# Patient Record
Sex: Male | Born: 1970
Health system: Southern US, Community
[De-identification: ages and names within clinical notes are randomized; demographics above are authoritative.]

## PROBLEM LIST (undated history)

## (undated) DIAGNOSIS — M1990 Unspecified osteoarthritis, unspecified site: Secondary | ICD-10-CM

## (undated) DIAGNOSIS — G4730 Sleep apnea, unspecified: Secondary | ICD-10-CM

## (undated) DIAGNOSIS — Z8614 Personal history of Methicillin resistant Staphylococcus aureus infection: Secondary | ICD-10-CM

## (undated) HISTORY — DX: Sleep apnea, unspecified: G47.30

## (undated) HISTORY — DX: Unspecified osteoarthritis, unspecified site: M19.90

---

## 2002-12-31 HISTORY — PX: OTHER SURGICAL HISTORY: SHX169

## 2003-05-02 DIAGNOSIS — Z8614 Personal history of Methicillin resistant Staphylococcus aureus infection: Secondary | ICD-10-CM

## 2003-05-02 HISTORY — DX: Personal history of Methicillin resistant Staphylococcus aureus infection: Z86.14

## 2007-03-27 HISTORY — PX: OTHER SURGICAL HISTORY: SHX169

## 2008-05-20 ENCOUNTER — Ambulatory Visit: Payer: Self-pay | Admitting: Family Medicine

## 2013-10-10 ENCOUNTER — Other Ambulatory Visit: Payer: Self-pay | Admitting: *Deleted

## 2013-10-10 ENCOUNTER — Ambulatory Visit (INDEPENDENT_AMBULATORY_CARE_PROVIDER_SITE_OTHER): Payer: BC Managed Care – PPO

## 2013-10-10 ENCOUNTER — Encounter: Payer: Self-pay | Admitting: Podiatry

## 2013-10-10 ENCOUNTER — Ambulatory Visit (INDEPENDENT_AMBULATORY_CARE_PROVIDER_SITE_OTHER): Payer: BC Managed Care – PPO | Admitting: Podiatry

## 2013-10-10 VITALS — BP 128/75 | HR 94 | Resp 16

## 2013-10-10 DIAGNOSIS — M722 Plantar fascial fibromatosis: Secondary | ICD-10-CM

## 2013-10-10 MED ORDER — TRIAMCINOLONE ACETONIDE 10 MG/ML IJ SUSP
10.0000 mg | Freq: Once | INTRAMUSCULAR | Status: AC
  Administered 2013-10-10: 10 mg

## 2013-10-10 NOTE — Patient Instructions (Signed)

## 2013-10-10 NOTE — Progress Notes (Signed)
Subjective:     Patient ID: Brett West, male   DOB: 05/07/1970, 43 y.o.   MRN: [id number]  HPI patient states that my right heel has started to really hurting and it is getting worse as time goes on   Review of Systems     Objective:   Physical Exam Neurovascular status intact with severe pain plantar aspect right heel at the insertion of the tendon into the calcaneus    Assessment:     Plantar fasciitis of the right heel    Plan:     Injected the right plantar fascia 3 mg Kenalog 5 mg like Marcaine mixture reviewed x-ray and discussed continued orthotic usage

## 2013-12-12 ENCOUNTER — Ambulatory Visit (INDEPENDENT_AMBULATORY_CARE_PROVIDER_SITE_OTHER): Payer: BC Managed Care – PPO | Admitting: Podiatry

## 2013-12-12 VITALS — BP 130/87 | HR 101 | Resp 16

## 2013-12-12 DIAGNOSIS — M79609 Pain in unspecified limb: Secondary | ICD-10-CM

## 2013-12-12 DIAGNOSIS — M722 Plantar fascial fibromatosis: Secondary | ICD-10-CM

## 2013-12-12 MED ORDER — TRIAMCINOLONE ACETONIDE 10 MG/ML IJ SUSP
10.0000 mg | Freq: Once | INTRAMUSCULAR | Status: AC
  Administered 2013-12-12: 10 mg

## 2013-12-12 NOTE — Progress Notes (Signed)
Subjective:     Patient ID: Brett West, male   DOB: 02/08/1971, 43 y.o.   MRN: [id number]  HPI patient states the right heel continues to hurt in its worse after periods of sitting or when he gets up in the morning   Review of Systems     Objective:   Physical Exam Neurovascular status intact with muscle strength adequate and found to have exquisite discomfort right plantar heel at the insertional point of the tendon into the calcaneus    Assessment:     Reoccurrence of plantar fasciitis right heel with inflammation    Plan:     Injected the right plantar fascia 3 mg Kenalog 5 of vesica Marcaine mixture and dispensed night splint with all instructions on usage. We are going to rehabilitation his old orthotics and he'll be seen back when those are returned

## 2014-01-09 ENCOUNTER — Encounter: Payer: Self-pay | Admitting: Podiatry

## 2014-01-28 ENCOUNTER — Encounter: Payer: Self-pay | Admitting: Podiatry

## 2014-03-06 ENCOUNTER — Ambulatory Visit (INDEPENDENT_AMBULATORY_CARE_PROVIDER_SITE_OTHER): Payer: BC Managed Care – PPO | Admitting: Podiatry

## 2014-03-06 VITALS — BP 130/77 | HR 97 | Resp 16

## 2014-03-06 DIAGNOSIS — M722 Plantar fascial fibromatosis: Secondary | ICD-10-CM

## 2014-03-06 MED ORDER — TRIAMCINOLONE ACETONIDE 10 MG/ML IJ SUSP
10.0000 mg | Freq: Once | INTRAMUSCULAR | Status: AC
  Administered 2014-03-06: 10 mg

## 2014-03-08 NOTE — Progress Notes (Signed)
Subjective:     Patient ID: Brett West, male   DOB: 05/13/1970, 43 y.o.   MRN: [id number]  HPIpatient continues to experience significant discomfort in the plantar heel right with inflammation and comes to pick up new orthotics today   Review of Systems     Objective:   Physical Exam Neurovascular status intact with continued discomfort in the plantar heel right at the insertion with fluid buildup noted    Assessment:     Combination acute and chronic plantar fasciitis right    Plan:     Reviewed condition and did careful plantar injections 3 mg Kenalog 5 mg Xylocaine and then dispensed new orthotics which may be too narrow for him and if they give him trouble we will recast him. Reappoint to recheck in 4 weeks

## 2014-04-03 ENCOUNTER — Ambulatory Visit (INDEPENDENT_AMBULATORY_CARE_PROVIDER_SITE_OTHER): Payer: BC Managed Care – PPO | Admitting: Podiatry

## 2014-04-03 ENCOUNTER — Encounter: Payer: Self-pay | Admitting: Podiatry

## 2014-04-03 ENCOUNTER — Ambulatory Visit: Payer: BC Managed Care – PPO | Admitting: Podiatry

## 2014-04-03 DIAGNOSIS — M722 Plantar fascial fibromatosis: Secondary | ICD-10-CM

## 2014-04-05 NOTE — Progress Notes (Signed)
Subjective:     Patient ID: Brett West, male   DOB: 03/26/1971, 43 y.o.   MRN: [id number]  HPI patient presents with pain in the heel that remains and moderate obesity which is certainly a complicating factor for this patient   Review of Systems     Objective:   Physical Exam Neurovascular status unchanged with severe discomfort plantar fascia still noted with improvement from previous treatments but present and unable to wear orthotics in all of his shoes    Assessment:     Mechanical dysfunction with chronic plantar fasciitis occurring    Plan:     Reviewed condition and recommended long-term different types of orthotics to try to disperse weight pattern better and scanned him for a second pair of orthotics which will act better in dress shoes and other shoes he needs to wear for work. Continue physical therapy and anti-inflammatory usage

## 2014-08-14 ENCOUNTER — Encounter: Payer: Self-pay | Admitting: Podiatry

## 2014-08-14 ENCOUNTER — Ambulatory Visit (INDEPENDENT_AMBULATORY_CARE_PROVIDER_SITE_OTHER): Payer: 59 | Admitting: Podiatry

## 2014-08-14 DIAGNOSIS — M722 Plantar fascial fibromatosis: Secondary | ICD-10-CM

## 2014-08-17 NOTE — Progress Notes (Signed)
Subjective:     Patient ID: Brett West, male   DOB: 01/25/1971, 44 y.o.   MRN: [id number]  HPI patient presents stating my right heel has been killing me and I'm not able to do any type of activities that I want to do on a daily basis   Review of Systems     Objective:   Physical Exam Neurovascular status intact with muscle strength adequate and range of motion within normal limits. Patient continues to have extensive discomfort right plantar heel at the insertional point of the tendon into the calcaneus with fluid buildup at the insertion and pain also in the center lateral side which is compensation and into the forefoot secondary to lifting the heel quicklty    Assessment:     Long-term chronic plantar fasciitis right which is significantly inhibiting his ability to ambulate    Plan:     Reviewed condition at great length due to long-standing nature failure to do any type of activities and gave it's probably best to consider correcting this. Patient wants to have it fixed and I allowed him to review a consent form going over alternative treatments that have been tried along with complications. Reviewed that he may develop pain in his arch and pain in other parts of his foot which can last for a while along with all complications listed. Patient wants surgery signed consent form is scheduled in the next 4 weeks for outpatient surgical procedure consisting of endoscopic plantar fasciotomy

## 2014-08-21 ENCOUNTER — Telehealth: Payer: Self-pay | Admitting: *Deleted

## 2014-08-21 NOTE — Telephone Encounter (Signed)
L/M to [facility] refurbish orthotics

## 2014-09-03 ENCOUNTER — Telehealth: Payer: Self-pay | Admitting: *Deleted

## 2014-09-03 NOTE — Telephone Encounter (Signed)
LEFT MESSAGE FOR PATIENT REGARDING ORTHOTICS

## 2014-09-18 ENCOUNTER — Telehealth: Payer: Self-pay | Admitting: *Deleted

## 2014-09-18 NOTE — Telephone Encounter (Signed)
Patient called stating that his insurance wasn't effective until after June and wanted to reschedule to June 7th.

## 2014-09-29 ENCOUNTER — Encounter: Payer: BLUE CROSS/BLUE SHIELD | Admitting: Podiatry

## 2014-10-01 ENCOUNTER — Telehealth: Payer: Self-pay | Admitting: *Deleted

## 2014-10-01 NOTE — Telephone Encounter (Signed)
We will cancel surgery for now due to insurance problems

## 2014-10-13 ENCOUNTER — Encounter: Payer: Self-pay | Admitting: Podiatry

## 2015-01-26 ENCOUNTER — Telehealth: Payer: Self-pay

## 2015-01-26 NOTE — Telephone Encounter (Signed)
Mailed out card to pt letting them  know that  orthotics where in and ready for pick up .

## 2015-03-15 ENCOUNTER — Ambulatory Visit (INDEPENDENT_AMBULATORY_CARE_PROVIDER_SITE_OTHER): Payer: 59 | Admitting: Podiatry

## 2015-03-15 ENCOUNTER — Encounter: Payer: Self-pay | Admitting: Podiatry

## 2015-03-15 VITALS — BP 139/85 | HR 70 | Resp 16

## 2015-03-15 DIAGNOSIS — M722 Plantar fascial fibromatosis: Secondary | ICD-10-CM | POA: Diagnosis not present

## 2015-03-15 NOTE — Patient Instructions (Signed)
Pre-Operative Instructions  Congratulations, you have decided to take an important step to improving your quality of life.  You can be assured that the doctors of [facility] will be with you every step of the way.  1. Plan to be at the surgery center/hospital at least 1 (one) hour prior to your scheduled time unless otherwise directed by the surgical center/hospital staff.  You must have a responsible adult accompany you, remain during the surgery and drive you home.  Make sure you have directions to the surgical center/hospital and know how to get there on time. 2. For hospital based surgery you will need to obtain a history and physical form from your family physician within 1 month prior to the date of surgery- we will give you a form for you primary physician.  3. We make every effort to accommodate the date you request for surgery.  There are however, times where surgery dates or times have to be moved.  We will contact you as soon as possible if a change in schedule is required.   4. No Aspirin/Ibuprofen for one week before surgery.  If you are on aspirin, any non-steroidal anti-inflammatory medications (Mobic, Aleve, Ibuprofen) you should stop taking it 7 days prior to your surgery.  You make take Tylenol  For pain prior to surgery.  5. Medications- If you are taking daily heart and blood pressure medications, seizure, reflux, allergy, asthma, anxiety, pain or diabetes medications, make sure the surgery center/hospital is aware before the day of surgery so they may notify you which medications to take or avoid the day of surgery. 6. No food or drink after midnight the night before surgery unless directed otherwise by surgical center/hospital staff. 7. No alcoholic beverages 24 hours prior to surgery.  No smoking 24 hours prior to or 24 hours after surgery. 8. Wear loose pants or shorts- loose enough to fit over bandages, boots, and casts. 9. No slip on shoes, sneakers are best. 10. Bring  your boot with you to the surgery center/hospital.  Also bring crutches or a walker if your physician has prescribed it for you.  If you do not have this equipment, it will be provided for you after surgery. 11. If you have not been contracted by the surgery center/hospital by the day before your surgery, call to confirm the date and time of your surgery. 12. Leave-time from work may vary depending on the type of surgery you have.  Appropriate arrangements should be made prior to surgery with your employer. 13. Prescriptions will be provided immediately following surgery by your doctor.  Have these filled as soon as possible after surgery and take the medication as directed. 14. Remove nail polish on the operative foot. 15. Wash the night before surgery.  The night before surgery wash the foot and leg well with the antibacterial soap provided and water paying special attention to beneath the toenails and in between the toes.  Rinse thoroughly with water and dry well with a towel.  Perform this wash unless told not to do so by your physician.  Enclosed: 1 Ice pack (please put in freezer the night before surgery)   1 Hibiclens skin cleaner   Pre-op Instructions  If you have any questions regarding the instructions, do not hesitate to call our office.  [place]: [address] [place], [place] [address] [phone]  [place]: [address] [place], [place] [address] [phone]  [place]: [address]  [place], [place] [address] [phone]  Dr. Richard   Tuchman DPM, Dr. Norman Regal DPM Dr. Richard Sikora DPM, Dr. M. Todd Hyatt DPM, Dr. Kathryn Egerton DPM 

## 2015-03-17 NOTE — Progress Notes (Signed)
Subjective:     Patient ID: Brett RobinsonJames Kwasny, male   DOB: 09/30/1970, 44 y.o.   MRN: [id number]  HPI patient states that this heel is still hurting and it's not seeming to get better and I had to have the other one fixed   Review of Systems     Objective:   Physical Exam Neurovascular status intact muscle strength adequate range of motion within normal limits with exquisite discomfort plantar aspect right heel at the insertional point of the tendon into the calcaneus with inflammation and fluid around the medial band noted    Assessment:     Continued plantar fasciitis right with failure to respond to numerous conservative care including injection treatment orthotic the treatment anti-inflammatories and night splint    Plan:     Reviewed condition at great length and due to long-term history and the fact the other one need to be corrected it's been recommended that this one be corrected. Patient wants surgery and I allowed patient to read consent form for correction explaining all alternative treatments and complications. Patient signed consent form and is given all preoperative instructions for the procedure and will have this done in the next few weeks

## 2015-03-24 ENCOUNTER — Ambulatory Visit (INDEPENDENT_AMBULATORY_CARE_PROVIDER_SITE_OTHER): Payer: 59 | Admitting: Family Medicine

## 2015-03-24 ENCOUNTER — Encounter: Payer: Self-pay | Admitting: Family Medicine

## 2015-03-24 VITALS — BP 130/80 | HR 95 | Temp 97.8°F | Resp 16 | Wt 323.8 lb

## 2015-03-24 DIAGNOSIS — Z91030 Bee allergy status: Secondary | ICD-10-CM | POA: Insufficient documentation

## 2015-03-24 DIAGNOSIS — T63441A Toxic effect of venom of bees, accidental (unintentional), initial encounter: Secondary | ICD-10-CM | POA: Insufficient documentation

## 2015-03-24 DIAGNOSIS — J309 Allergic rhinitis, unspecified: Secondary | ICD-10-CM | POA: Insufficient documentation

## 2015-03-24 DIAGNOSIS — J069 Acute upper respiratory infection, unspecified: Secondary | ICD-10-CM

## 2015-03-24 DIAGNOSIS — G4733 Obstructive sleep apnea (adult) (pediatric): Secondary | ICD-10-CM | POA: Insufficient documentation

## 2015-03-24 DIAGNOSIS — M1990 Unspecified osteoarthritis, unspecified site: Secondary | ICD-10-CM | POA: Insufficient documentation

## 2015-03-24 DIAGNOSIS — J3089 Other allergic rhinitis: Secondary | ICD-10-CM

## 2015-03-24 DIAGNOSIS — Z9989 Dependence on other enabling machines and devices: Secondary | ICD-10-CM

## 2015-03-24 DIAGNOSIS — J329 Chronic sinusitis, unspecified: Secondary | ICD-10-CM | POA: Insufficient documentation

## 2015-03-24 MED ORDER — MONTELUKAST SODIUM 10 MG PO TABS: 10.0000 mg | ORAL_TABLET | Freq: Every day | ORAL | Status: DC

## 2015-03-24 NOTE — Progress Notes (Signed)
       Patient: Brett RobinsonJames West Male    DOB: 03/01/1971   44 y.o.   MRN: [id number] Visit Date: 03/24/2015  Today's Provider: Lorie PhenixNancy Andilynn Delavega, MD   Chief Complaint  Patient presents with  . Sinusitis   Subjective:    Sinusitis This is a new problem. The current episode started in the past 7 days. The problem is unchanged. There has been no fever. Associated symptoms include congestion, coughing, headaches, sinus pressure and sneezing. Pertinent negatives include no ear pain, shortness of breath or sore throat. Past treatments include nothing.    Started with a little cold last week, for one day. Then, started with congestion and draining since over the weekend.  No fevers. No sore throat. Does have a cough.       No Known Allergies Previous Medications   EPINEPHRINE, ANAPHYLAXIS THERAPY AGENTS,    EPIPEN, 0.3MG /0.3ML (Injection Device)  1 Device Use as directed, as needed for 0 days  Quantity: 2;  Refills: 5   Ordered :03-Apr-2011  Brett West, Brett West ;  Started 03-Apr-2011 Active Comments: Medication taken as needed. Generic please.   FLUTICASONE (FLONASE) 50 MCG/ACT NASAL SPRAY    Place into the nose as needed.     Review of Systems  HENT: Positive for congestion, postnasal drip, rhinorrhea, sinus pressure and sneezing. Negative for ear pain and sore throat.   Respiratory: Positive for cough and wheezing. Negative for chest tightness and shortness of breath.   Neurological: Positive for headaches.    Social History  Substance Use Topics  . Smoking status: Former Games developermoker  . Smokeless tobacco: Not on file  . Alcohol Use: No   Objective:   BP 130/80 mmHg  Pulse 95  Temp(Src) 97.8 F (36.6 C) (Oral)  Resp 16  Wt 323 lb 12.8 oz (146.875 kg)  SpO2 96%  Physical Exam  Constitutional: He is oriented to person, place, and time. He appears well-developed and well-nourished.  HENT:  Head: Normocephalic and atraumatic.  Right Ear: Tympanic membrane and external ear normal.    Left Ear: Tympanic membrane and external ear normal.  Mouth/Throat: Uvula is midline and oropharynx is clear and moist.  Eyes: Conjunctivae and EOM are normal. Pupils are equal, round, and reactive to light.  Neck: Normal range of motion. Neck supple.  Cardiovascular: Normal rate and regular rhythm.   Pulmonary/Chest: Effort normal and breath sounds normal. He has no wheezes. He has no rales.  Neurological: He is alert and oriented to person, place, and time.        Assessment & Plan:     1. Other allergic rhinitis Will restart medication. Patient instructed to call back if condition worsens or does not improve.    - montelukast (SINGULAIR) 10 MG tablet; Take 1 tablet (10 mg total) by mouth at bedtime.  Dispense: 30 tablet; Refill: 3  2. Upper respiratory infection Suspect viral URI is underlying cause of current symptoms.   Mucinex BID as needed.  Patient instructed to call back if condition worsens or does not improve, especially fever, better and then worse again, or any new or unusual symptoms.    Follow up for CPE. Scheduled for foot surgery in about 2 weeks.        Lorie PhenixNancy Galen Russman, MD  [facility]

## 2015-04-01 HISTORY — PX: PLANTAR FASCIECTOMY: SUR600

## 2015-04-08 ENCOUNTER — Telehealth: Payer: Self-pay | Admitting: *Deleted

## 2015-04-08 NOTE — Telephone Encounter (Signed)
Authorization was obtained for surgery scheduled for 04/13/2015 for an Endoscopic Plantar Fasciotomy right foot by [employer] [employer].  Authorization number is [id number].  Authorization was faxed to Aram [facility]Cynthia at [facility].

## 2015-04-13 DIAGNOSIS — M722 Plantar fascial fibromatosis: Secondary | ICD-10-CM | POA: Diagnosis not present

## 2015-04-14 ENCOUNTER — Telehealth: Payer: Self-pay | Admitting: *Deleted

## 2015-04-14 NOTE — Telephone Encounter (Signed)
Pt's wife Lockie ParesJaclyn called states pt was told to call if there was blood on his dressing.  I called pt at [phone] and pt states the area of blood is a little larger than a 50 cent piece, and dry.  I told the pt that was fine, it was his blood, sterile, and made a great protective splint.  Pt states he got the Hydrocodone 10/300 filled but his insurance did not cover, I told him when he called for a refill remind our office and we would change the refill.

## 2015-04-20 ENCOUNTER — Telehealth: Payer: Self-pay | Admitting: Family Medicine

## 2015-04-20 MED ORDER — AMOXICILLIN-POT CLAVULANATE 875-125 MG PO TABS
1.0000 | ORAL_TABLET | Freq: Two times a day (BID) | ORAL | Status: DC
Start: 2015-04-20 — End: 2015-06-09

## 2015-04-20 NOTE — Telephone Encounter (Signed)
Sent in Augmentin generic.  Should be covered. Thanks.

## 2015-04-20 NOTE — Telephone Encounter (Signed)
Pt stated he saw Dr. Elease HashimotoMaloney on 03/24/15 and was advised to call back if he thought he had developed a sinus infection and needed an antibiotic. Pt would like it sent to [facility] [address] Pt stated that he has a lot of congestion & sinus pressure. Pt request that the medication that is sent in be covered under his insurance and be the best value. I advised pt that we really don't know what is and isn't covered until the pharmacy runs the insurance b/c there are so many different plans and policies. Please advise. Thanks TNP

## 2015-04-22 ENCOUNTER — Ambulatory Visit (INDEPENDENT_AMBULATORY_CARE_PROVIDER_SITE_OTHER): Payer: 59 | Admitting: Podiatry

## 2015-04-22 DIAGNOSIS — Z09 Encounter for follow-up examination after completed treatment for conditions other than malignant neoplasm: Secondary | ICD-10-CM

## 2015-04-22 DIAGNOSIS — M722 Plantar fascial fibromatosis: Secondary | ICD-10-CM

## 2015-04-22 MED ORDER — HYDROCODONE-ACETAMINOPHEN 10-325 MG PO TABS: 1.0000 | ORAL_TABLET | Freq: Three times a day (TID) | ORAL | Status: DC | PRN

## 2015-04-26 NOTE — Progress Notes (Signed)
Presents today for follow-up of an endoscopic plantar fasciotomy performed 04/13/2015. States he is doing pretty well.  Objective: Vital signs stable alert and oriented 3 sutures are intact margins are well coapted no pain on palpation medial calcaneal tubercle of the foot. No signs of infection.  Assessment: Well-healing endoscopic plantar fasciotomy.  Plan: I placed him in Band-Aid today to cover the sutures. He was to soaking Epsom salts and warm water soaks continue use of the Cam Walker 24 7 until told otherwise by primary surgeon. He will follow up with us in 1 week.

## 2015-04-29 ENCOUNTER — Encounter: Payer: Self-pay | Admitting: Podiatry

## 2015-04-29 NOTE — Progress Notes (Signed)
DOS 04-13-15  EPF right   Rx'd Vicodin 10-325 #25 

## 2015-04-30 ENCOUNTER — Encounter: Payer: 59 | Admitting: Family Medicine

## 2015-05-07 ENCOUNTER — Ambulatory Visit (INDEPENDENT_AMBULATORY_CARE_PROVIDER_SITE_OTHER): Payer: 59 | Admitting: Podiatry

## 2015-05-07 DIAGNOSIS — Z09 Encounter for follow-up examination after completed treatment for conditions other than malignant neoplasm: Secondary | ICD-10-CM

## 2015-05-07 DIAGNOSIS — M722 Plantar fascial fibromatosis: Secondary | ICD-10-CM

## 2015-05-10 NOTE — Progress Notes (Signed)
Patient ID: Brett West, male   DOB: 01/07/1971, 45 y.o.   MRN: [id number]  Subjective: Brett West is a 45 y.o. is seen today in office s/p EPF preformed on 04/13/15. He states he has not had any pain. Discontinue the cam boot. Denies any systemic complaints such as fevers, chills, nausea, vomiting. No calf pain, chest pain, shortness of breath.   Objective: General: No acute distress, AAOx3  DP/PT pulses palpable 2/4, CRT < 3 sec to all digits.  Protective sensation intact. Motor function intact.  Right foot: Incision is well coapted without any evidence of dehiscence. There is no surrounding erythema, ascending cellulitis, fluctuance, crepitus, malodor, drainage/purulence. There is faint edema around the surgical site. There is no pain along the surgical site.  No other areas of tenderness to bilateral lower extremities.  No other open lesions or pre-ulcerative lesions.  No pain with calf compression, swelling, warmth, erythema.   Assessment and Plan:  Status post right EPF, doing well with no complications   -Treatment options discussed including all alternatives, risks, and complications -Inserted transition to a Darco shoe at this time. As his pain decreases he can return to regular shoe as tolerated. Discussed the gradual transition. Compression sock to help with swelling. -Ice/elevation -Pain medication as needed. -Monitor for any clinical signs or symptoms of infection and DVT/PE and directed to call the office immediately should any occur or go to the ER. -Follow-up in 4 weeks if symptoms continue or sooner if any problems arise. In the meantime, encouraged to call the office with any questions, concerns, change in symptoms.   Ovid CurdMatthew Wagoner, DPM

## 2015-06-04 ENCOUNTER — Other Ambulatory Visit: Payer: 59

## 2015-06-09 ENCOUNTER — Ambulatory Visit (INDEPENDENT_AMBULATORY_CARE_PROVIDER_SITE_OTHER): Payer: 59 | Admitting: Podiatry

## 2015-06-09 ENCOUNTER — Encounter: Payer: Self-pay | Admitting: Podiatry

## 2015-06-09 VITALS — BP 145/81 | HR 78 | Resp 16

## 2015-06-09 DIAGNOSIS — M779 Enthesopathy, unspecified: Secondary | ICD-10-CM

## 2015-06-09 DIAGNOSIS — M722 Plantar fascial fibromatosis: Secondary | ICD-10-CM

## 2015-06-09 DIAGNOSIS — Z09 Encounter for follow-up examination after completed treatment for conditions other than malignant neoplasm: Secondary | ICD-10-CM

## 2015-06-09 MED ORDER — DICLOFENAC SODIUM 75 MG PO TBEC: 75.0000 mg | DELAYED_RELEASE_TABLET | Freq: Two times a day (BID) | ORAL | Status: DC

## 2015-06-09 NOTE — Progress Notes (Signed)
Subjective:     Patient ID: Brett West, male   DOB: 10-May-1970, 45 y.o.   MRN: [id number]  HPI patient presents stating I have trouble with my dress shoes and I have pain in my foot but my heel feels better   Review of Systems     Objective:   Physical Exam  neurovascular status intact with excellent healing of surgical site right and left plantar heel with no discomfort upon plantar fascial insertion but discomfort more in the lateral side of the foot    Assessment:      inflammatory changes as the patient has been quite active    Plan:      explained occasional boot usage ice therapy and wrote prescription for diclofenac 75 mg twice a day. Reappoint to recheck

## 2015-06-24 ENCOUNTER — Other Ambulatory Visit: Payer: Self-pay | Admitting: Podiatry

## 2015-06-24 MED ORDER — DICLOFENAC SODIUM 75 MG PO TBEC: 75.0000 mg | DELAYED_RELEASE_TABLET | Freq: Two times a day (BID) | ORAL | Status: DC

## 2015-06-24 NOTE — Progress Notes (Signed)
Patient called and stated he needed the voltaren sent to [facility]. This was sent to his pharmacy.

## 2015-07-08 ENCOUNTER — Other Ambulatory Visit: Payer: Self-pay | Admitting: Family Medicine

## 2015-07-08 DIAGNOSIS — J3089 Other allergic rhinitis: Secondary | ICD-10-CM

## 2015-07-08 MED ORDER — MONTELUKAST SODIUM 10 MG PO TABS: 10.0000 mg | ORAL_TABLET | Freq: Every day | ORAL | Status: DC

## 2015-07-08 NOTE — Telephone Encounter (Signed)
Last refill 03/24/2015  Last OV 03/24/2015

## 2015-07-08 NOTE — Telephone Encounter (Signed)
Pt contacted office for refill request on the following medications: montelukast (SINGULAIR) [facility] [address] Thanks TNP

## 2015-09-09 ENCOUNTER — Encounter: Payer: Self-pay | Admitting: Family Medicine

## 2015-09-09 ENCOUNTER — Ambulatory Visit (INDEPENDENT_AMBULATORY_CARE_PROVIDER_SITE_OTHER): Payer: 59 | Admitting: Family Medicine

## 2015-09-09 VITALS — BP 122/68 | HR 84 | Temp 98.6°F | Resp 16 | Ht 74.0 in | Wt 329.0 lb

## 2015-09-09 DIAGNOSIS — R5383 Other fatigue: Secondary | ICD-10-CM | POA: Diagnosis not present

## 2015-09-09 DIAGNOSIS — G479 Sleep disorder, unspecified: Secondary | ICD-10-CM | POA: Diagnosis not present

## 2015-09-09 DIAGNOSIS — J3089 Other allergic rhinitis: Secondary | ICD-10-CM | POA: Diagnosis not present

## 2015-09-09 DIAGNOSIS — E669 Obesity, unspecified: Secondary | ICD-10-CM

## 2015-09-09 MED ORDER — FLUTICASONE PROPIONATE 50 MCG/ACT NA SUSP: 2.0000 | Freq: Every day | NASAL | Status: DC

## 2015-09-09 MED ORDER — MONTELUKAST SODIUM 10 MG PO TABS: 10.0000 mg | ORAL_TABLET | Freq: Every day | ORAL | Status: DC

## 2015-09-09 NOTE — Progress Notes (Signed)
Patient ID: Brett West, West   DOB: 19-Jul-1970, 45 y.o.   MRN: [id number]         Patient: Brett West    DOB: 26-Apr-1971   45 y.o.   MRN: [id number] Visit Date: 09/09/2015  Today's Provider: Lorie Phenix, MD   No chief complaint on file.  Subjective:    HPI   Pt comes in today to discuss being tested for low testosterone.  Wife wants it checked.  He complains of chronic fatigue; weight gain and also a low sex drive.   Does have snoring also. Was supposed to get sleep study years ago, but did not. Has gained a lot of weight.  Has a new job.   Does eat out more.   Takes medication without any difficulty. Does need a new epi-pen.  Also needs his allergy medication refilled.   Also having ankle swelling at times.      No Known Allergies Previous Medications   DICLOFENAC (VOLTAREN) 75 MG EC TABLET    Take 1 tablet (75 mg total) by mouth 2 (two) times daily.   EPINEPHRINE, ANAPHYLAXIS THERAPY AGENTS,    EPIPEN, 0.3MG /0.3ML (Injection Device)  1 Device Use as directed, as needed for 0 days  Quantity: 2;  Refills: 5   Ordered :03-Apr-2011  Allene Dillon ;  Started 03-Apr-2011 Active Comments: Medication taken as needed. Generic please.    Review of Systems  Constitutional: Positive for fatigue and unexpected weight change (Has gained 20-30 pounds in the last year.). Negative for fever, chills, diaphoresis and activity change.  Respiratory: Negative for apnea, cough, choking, chest tightness, shortness of breath, wheezing and stridor.        Pt reports he does snore at night.    Cardiovascular: Positive for leg swelling (Sometimes ankles swell). Negative for chest pain and palpitations.  Gastrointestinal: Negative.   Endocrine: Negative for cold intolerance, heat intolerance, polydipsia, polyphagia and polyuria.  Genitourinary: Negative for dysuria, urgency, frequency, hematuria, flank pain, decreased urine volume, discharge, penile swelling, scrotal swelling,  enuresis, difficulty urinating, genital sores, penile pain and testicular pain.  Musculoskeletal: Negative.     Social History  Substance Use Topics  . Smoking status: Former Games developer  . Smokeless tobacco: Not on file  . Alcohol Use: No   Objective:   BP 122/68 mmHg  Pulse 84  Temp(Src) 98.6 F (37 C) (Oral)  Resp 16  Ht  (1.88 m)  Wt 329 lb (149.233 kg)  BMI 42.22 kg/m2  Physical Exam  Constitutional: He is oriented to person, place, and time. He appears well-developed and well-nourished.  Cardiovascular: Normal rate, regular rhythm, normal heart sounds and intact distal pulses.   Pulmonary/Chest: Effort normal and breath sounds normal.  Neurological: He is alert and oriented to person, place, and time.  Skin: Skin is warm and dry.  Psychiatric: He has a normal mood and affect. His behavior is normal. Judgment and thought content normal.      Assessment & Plan:     1. Other allergic rhinitis Condition is stable. Please continue current medication and  plan of care as noted.   - montelukast (SINGULAIR) 10 MG tablet; Take 1 tablet (10 mg total) by mouth at bedtime.  Dispense: 30 tablet; Refill: 5 - fluticasone (FLONASE) 50 MCG/ACT nasal spray; Place 2 sprays into both nostrils daily.  Dispense: 16 g; Refill: 5 - CBC with Differential/Platelet  2. Obesity Worsening. Will check labs. Further plan pending these results.   - Lipid panel -  TSH - Comprehensive metabolic panel - Testosterone - Hemoglobin A1c  3. Other fatigue New problem.  Will check labs. Further plan after labs.   - Lipid panel - TSH - Comprehensive metabolic panel - Testosterone - Hemoglobin A1c - EKG 12-Lead   4. Disordered sleep ESS is only 4 but does snore nightly and has multiple co-morbidities.   - Home sleep test; Future   Patient was seen and examined by Leo GrosserNancy J. Reya Aurich, MD, and note scribed by Kavin LeechLaura Walsh, CMA.  I have reviewed the document for accuracy and completeness and I agree  with above. - Leo GrosserNancy J. Salote Weidmann, MD      Lorie PhenixNancy Aleasha Fregeau, MD  [facility]

## 2015-09-10 LAB — COMPREHENSIVE METABOLIC PANEL
ALT: 41 IU/L (ref 0–44)
AST: 29 IU/L (ref 0–40)
Albumin/Globulin Ratio: 1.7 (ref 1.2–2.2)
Albumin: 4 g/dL (ref 3.5–5.5)
Alkaline Phosphatase: 79 IU/L (ref 39–117)
BUN/Creatinine Ratio: 13 (ref 9–20)
BUN: 15 mg/dL (ref 6–24)
Bilirubin Total: 0.3 mg/dL (ref 0.0–1.2)
CALCIUM: 9 mg/dL (ref 8.7–10.2)
CO2: 22 mmol/L (ref 18–29)
CREATININE: 1.13 mg/dL (ref 0.76–1.27)
Chloride: 101 mmol/L (ref 96–106)
GFR, EST AFRICAN AMERICAN: 91 mL/min/{1.73_m2} (ref >59)
GFR, EST NON AFRICAN AMERICAN: 79 mL/min/{1.73_m2} (ref >59)
GLUCOSE: 120 mg/dL — AB (ref 65–99)
Globulin, Total: 2.4 g/dL (ref 1.5–4.5)
POTASSIUM: 4.3 mmol/L (ref 3.5–5.2)
Sodium: 140 mmol/L (ref 134–144)
TOTAL PROTEIN: 6.4 g/dL (ref 6.0–8.5)

## 2015-09-10 LAB — CBC WITH DIFFERENTIAL/PLATELET
BASOS: 0 %
Basophils Absolute: 0 10*3/uL (ref 0.0–0.2)
EOS (ABSOLUTE): 0.1 10*3/uL (ref 0.0–0.4)
EOS: 2 %
HEMATOCRIT: 43.9 % (ref 37.5–51.0)
HEMOGLOBIN: 14.7 g/dL (ref 12.6–17.7)
IMMATURE GRANS (ABS): 0 10*3/uL (ref 0.0–0.1)
Immature Granulocytes: 0 %
LYMPHS: 46 %
Lymphocytes Absolute: 3.1 10*3/uL (ref 0.7–3.1)
MCH: 27.9 pg (ref 26.6–33.0)
MCHC: 33.5 g/dL (ref 31.5–35.7)
MCV: 84 fL (ref 79–97)
MONOCYTES: 8 %
Monocytes Absolute: 0.5 10*3/uL (ref 0.1–0.9)
NEUTROS ABS: 2.9 10*3/uL (ref 1.4–7.0)
Neutrophils: 44 %
Platelets: 219 10*3/uL (ref 150–379)
RBC: 5.26 x10E6/uL (ref 4.14–5.80)
RDW: 14.1 % (ref 12.3–15.4)
WBC: 6.7 10*3/uL (ref 3.4–10.8)

## 2015-09-10 LAB — TSH: TSH: 2.04 u[IU]/mL (ref 0.450–4.500)

## 2015-09-10 LAB — LIPID PANEL
CHOL/HDL RATIO: 5.6 ratio — AB (ref 0.0–5.0)
Cholesterol, Total: 178 mg/dL (ref 100–199)
HDL: 32 mg/dL — AB (ref >39)
LDL Calculated: 75 mg/dL (ref 0–99)
Triglycerides: 354 mg/dL — ABNORMAL HIGH (ref 0–149)
VLDL Cholesterol Cal: 71 mg/dL — ABNORMAL HIGH (ref 5–40)

## 2015-09-10 LAB — HEMOGLOBIN A1C
ESTIMATED AVERAGE GLUCOSE: 120 mg/dL
HEMOGLOBIN A1C: 5.8 % — AB (ref 4.8–5.6)

## 2015-09-10 LAB — TESTOSTERONE: Testosterone: 215 ng/dL — ABNORMAL LOW (ref 348–1197)

## 2015-09-13 ENCOUNTER — Other Ambulatory Visit: Payer: Self-pay

## 2015-09-13 DIAGNOSIS — R7989 Other specified abnormal findings of blood chemistry: Secondary | ICD-10-CM | POA: Insufficient documentation

## 2015-09-13 MED ORDER — TESTOSTERONE 20.25 MG/1.25GM (1.62%) TD GEL: TRANSDERMAL | Status: DC

## 2015-09-13 NOTE — Telephone Encounter (Signed)
Notes Recorded by Lorie PhenixNancy Maloney, MD on 09/11/2015 at 11:20 AM Follow up labs normal. Proceed with plan as discussed. Thanks. Notes Recorded by Lorie PhenixNancy Maloney, MD on 09/10/2015 at 2:19 PM Add additional labs to blood work, PSA and Prolactin.  Does have low testosterone, can start testosterone supplement if patient would like to try it.  Androgel 2 pumps daily, recheck ov with Dr. Sullivan LoneGilbert in 4 weeks to meet him and discuss further treatment. Thanks.

## 2015-09-13 NOTE — Telephone Encounter (Signed)
Pt advised.   Thanks,   -Alvina Strother  

## 2015-09-13 NOTE — Telephone Encounter (Signed)
-----   Message from Lorie PhenixNancy Maloney, MD sent at 09/10/2015  2:19 PM EDT ----- Add additional labs to blood work, PSA and Prolactin.   Does have low testosterone, can start testosterone supplement if patient would like to try it.   Androgel 2 pumps daily, recheck ov with Dr. Sullivan LoneGilbert in 4 weeks to meet him and  discuss further treatment. Thanks.

## 2015-09-14 ENCOUNTER — Other Ambulatory Visit: Payer: Self-pay

## 2015-09-14 DIAGNOSIS — R7989 Other specified abnormal findings of blood chemistry: Secondary | ICD-10-CM

## 2015-09-14 LAB — PROLACTIN: Prolactin: 8.6 ng/mL (ref 4.0–15.2)

## 2015-09-14 LAB — PSA: Prostate Specific Ag, Serum: 0.6 ng/mL (ref 0.0–4.0)

## 2015-09-14 LAB — SPECIMEN STATUS REPORT

## 2015-09-14 MED ORDER — TESTOSTERONE 50 MG/5GM (1%) TD GEL: 5.0000 g | Freq: Every day | TRANSDERMAL | Status: DC

## 2015-09-24 ENCOUNTER — Telehealth: Payer: Self-pay | Admitting: Family Medicine

## 2015-09-24 DIAGNOSIS — R7989 Other specified abnormal findings of blood chemistry: Secondary | ICD-10-CM

## 2015-09-24 NOTE — Telephone Encounter (Signed)
LMTCB 09/24/2015  Thanks,   -[name]  

## 2015-09-24 NOTE — Telephone Encounter (Signed)
Yes. Needs labs for another level. Thanks.

## 2015-09-24 NOTE — Telephone Encounter (Signed)
Pt advised; lab sheet at the front desk.  Thanks,   -[name]  

## 2015-09-24 NOTE — Telephone Encounter (Signed)
Pt called to get an update on the prior authorization for testosterone (TESTIM) 50 MG/5GM (1%) GEL. There was a fax scanned into the chart on 09/22/15 that looks like it was denied. I wasn't sure if there are other steps we can try to get it approved or if he needed to try something else first. Please advise. Thanks TNP

## 2015-09-24 NOTE — Telephone Encounter (Signed)
I think he needed two low testosterone readings.   Thanks,   -Vernona RiegerLaura

## 2015-10-01 LAB — TESTOSTERONE: Testosterone: 229 ng/dL — ABNORMAL LOW (ref 348–1197)

## 2015-10-05 ENCOUNTER — Telehealth: Payer: Self-pay

## 2015-10-05 NOTE — Telephone Encounter (Signed)
PA Sent.   Thanks,   -Vernona RiegerLaura

## 2015-10-05 NOTE — Telephone Encounter (Signed)
-----   Message from Lorie PhenixNancy Maloney, MD sent at 10/01/2015  8:53 AM EDT ----- Second level low. Please try to get PA for testosterone again. Thanks.

## 2015-10-07 ENCOUNTER — Other Ambulatory Visit: Payer: Self-pay | Admitting: Family Medicine

## 2015-10-07 NOTE — Telephone Encounter (Signed)
Pt states the Rx for testosterone (TESTIM) 50 MG/5GM (1%) GEL is too expensive.  Pt is requesting a less expensive Rx sent if possible or can this same Rx be sent to [facility] [address] to see if it would be any cheaper going through [facility].  [phone]/[id number]#[phone]/MW

## 2015-10-07 NOTE — Telephone Encounter (Signed)
All expensive, but [facility]. Thanks.

## 2015-10-08 NOTE — Telephone Encounter (Signed)
Rx called into [facility].   Thanks,   -Vernona RiegerLaura

## 2015-10-15 ENCOUNTER — Ambulatory Visit (INDEPENDENT_AMBULATORY_CARE_PROVIDER_SITE_OTHER): Payer: 59 | Admitting: Family Medicine

## 2015-10-15 ENCOUNTER — Encounter: Payer: Self-pay | Admitting: Family Medicine

## 2015-10-15 VITALS — BP 112/72 | HR 84 | Temp 98.2°F | Resp 16 | Wt 328.0 lb

## 2015-10-15 DIAGNOSIS — J3089 Other allergic rhinitis: Secondary | ICD-10-CM

## 2015-10-15 DIAGNOSIS — Z309 Encounter for contraceptive management, unspecified: Secondary | ICD-10-CM | POA: Diagnosis not present

## 2015-10-15 DIAGNOSIS — R7989 Other specified abnormal findings of blood chemistry: Secondary | ICD-10-CM

## 2015-10-15 DIAGNOSIS — E291 Testicular hypofunction: Secondary | ICD-10-CM | POA: Diagnosis not present

## 2015-10-15 DIAGNOSIS — Z3009 Encounter for other general counseling and advice on contraception: Secondary | ICD-10-CM

## 2015-10-15 MED ORDER — MONTELUKAST SODIUM 10 MG PO TABS: 10.0000 mg | ORAL_TABLET | Freq: Every day | ORAL | Status: DC

## 2015-10-15 MED ORDER — FLUTICASONE PROPIONATE 50 MCG/ACT NA SUSP: 2.0000 | Freq: Every day | NASAL | Status: DC

## 2015-10-15 MED ORDER — TESTOSTERONE 50 MG/5GM (1%) TD GEL: 5.0000 g | Freq: Every day | TRANSDERMAL | Status: DC

## 2015-10-15 NOTE — Progress Notes (Signed)
Subjective:    Patient ID: Brett West, male    DOB: 08-31-1970, 45 y.o.   MRN: [id number]  HPI  Allergic Rhinitis: Brett West is here for evaluation of possible allergic rhinitis. Patient's symptoms include wheezing. He patient has been suffering from these symptoms for approximately 2 weeks. Pt not taking Flonase and Singulair. Needs refills.   Follow up for Hypogonadism  The patient was last seen for this 1 weeks ago. Changes made at last visit include adding Testim.  He reports poor compliance with treatment. Pt reports he can not afford testosterone medication. Is asking if there is another alternative. Would like to know the long term effects of having untreated low testosterone.   ------------------------------------------------------------------------------------     Review of Systems  Constitutional: Negative for fever, chills, diaphoresis, activity change, appetite change, fatigue and unexpected weight change.  Respiratory: Positive for wheezing. Negative for cough and shortness of breath.   Endocrine: Positive for heat intolerance.   BP 112/72 mmHg  Pulse 84  Temp(Src) 98.2 F (36.8 C) (Oral)  Resp 16  Wt 328 lb (148.78 kg)  SpO2 96%   Patient Active Problem List   Diagnosis Date Noted  . Low testosterone 09/13/2015  . Allergic rhinitis 03/24/2015  . Allergy to yellow jackets 03/24/2015  . Allergic reaction to bee sting 03/24/2015  . Adiposity 03/24/2015  . Arthritis, degenerative 03/24/2015  . Recurrent sinus infections 03/24/2015  . Disordered sleep 03/24/2015   Past Medical History  Diagnosis Date  . Arthritis    Current Outpatient Prescriptions on File Prior to Visit  Medication Sig  . EPINEPHRINE, ANAPHYLAXIS THERAPY AGENTS, EPIPEN, 0.3MG /0.3ML (Injection Device)  1 Device Use as directed, as needed for 0 days  Quantity: 2;  Refills: 5   Ordered :03-Apr-2011  Allene Dillon ;  Started 03-Apr-2011 Active Comments: Medication taken  as needed. Generic please.  . testosterone (TESTIM) 50 MG/5GM (1%) GEL Place 5 g onto the skin daily. (Patient not taking: Reported on 10/15/2015)   No current facility-administered medications on file prior to visit.   No Known Allergies Past Surgical History  Procedure Laterality Date  . Sebasceous cyst  03/27/2007    Dr. Michela Pitcher  . S/p foot surgery  12/2002   Social History   Social History  . Marital Status: Married    Spouse Name: N/A  . Number of Children: N/A  . Years of Education: N/A   Occupational History  . Not on file.   Social History Main Topics  . Smoking status: Former Smoker    Quit date: 04/30/2004  . Smokeless tobacco: Not on file  . Alcohol Use: No  . Drug Use: No  . Sexual Activity: Not on file   Other Topics Concern  . Not on file   Social History Narrative   Family History  Problem Relation Age of Onset  . Arthritis Father        Objective:   Physical Exam  Constitutional: He is oriented to person, place, and time. He appears well-developed and well-nourished.  Neurological: He is alert and oriented to person, place, and time.  Psychiatric: He has a normal mood and affect. His behavior is normal.  Vitals reviewed. BP 112/72 mmHg  Pulse 84  Temp(Src) 98.2 F (36.8 C) (Oral)  Resp 16  Wt 328 lb (148.78 kg)  SpO2 96%       Assessment & Plan:  1. Other allergic rhinitis Worsening. Refill medications as below. - montelukast (SINGULAIR) 10 MG tablet; Take 1  tablet (10 mg total) by mouth at bedtime.  Dispense: 30 tablet; Refill: 5 - fluticasone (FLONASE) 50 MCG/ACT nasal spray; Place 2 sprays into both nostrils daily.  Dispense: 16 g; Refill: 5  2. Low testosterone Expensive medication. Pt concerned about cost. Will reprint medication to see if med is more cost effective at other pharmacies. Advised pt low testosterone is not medically necessary to treat. Will defer filling medication if it is too pricey. - testosterone (TESTIM) 50 MG/5GM  (1%) GEL; Place 5 g onto the skin daily.  Dispense: 30 Tube; Refill: 1  3. Vasectomy evaluation Pt requesting referral. - Ambulatory referral to Urology    Patient seen and examined by Leo GrosserNancy J. Zackory Pudlo, MD, and note scribed by Allene DillonEmily Drozdowski, CMA.   I have reviewed the document for accuracy and completeness and I agree with above. Leo Grosser- Lilyonna Steidle J. Jahzeel Poythress, MD   Lorie PhenixNancy Nieves Barberi, MD

## 2015-10-20 ENCOUNTER — Encounter: Payer: Self-pay | Admitting: Family Medicine

## 2015-11-04 ENCOUNTER — Encounter: Payer: Self-pay | Admitting: Urology

## 2015-11-04 ENCOUNTER — Ambulatory Visit (INDEPENDENT_AMBULATORY_CARE_PROVIDER_SITE_OTHER): Payer: 59 | Admitting: Urology

## 2015-11-04 VITALS — BP 130/78 | HR 76 | Ht 74.0 in | Wt 332.7 lb

## 2015-11-04 DIAGNOSIS — Z309 Encounter for contraceptive management, unspecified: Secondary | ICD-10-CM

## 2015-11-04 DIAGNOSIS — Z3009 Encounter for other general counseling and advice on contraception: Secondary | ICD-10-CM

## 2015-11-04 MED ORDER — DIAZEPAM 10 MG PO TABS: ORAL_TABLET | ORAL | Status: DC

## 2015-11-04 NOTE — Progress Notes (Signed)
11/04/2015 4:04 PM   Brett West 02/26/1971 [id number]  Referring provider: Lorie PhenixNancy Maloney, MD [address] [place], [place] [id number]  Chief Complaint  Patient presents with  . VAS Consult    referred by Dr. Elease West    HPI: [name] is a 45 year old male presents today as a referral for a vasectomy by Dr. Elease West.    Patient has 2 children, one son and one daughter, and wishes to end his family unit at this point.  Patient denies any history of chronic prostatitis, epididymitis, orchitis, or other genital pain.  Today, we discussed what the vas deferens is, where it is located, and its function. We reviewed the procedure for vasectomy, it's risks, benefits, alternatives, and likelihood of achieving his goals.   We discussed in detail the procedure, complications, and recovery as well as the need for clearance prior to unprotected intercourse. We discussed that vasectomy does not protect against sexually transmitted diseases. We discussed that this procedure does not result in immediate sterility and that they would need to use other forms of birth control until he has been cleared with a three month negative postvasectomy semen analyses.  I explained that the procedure is considered to be permanent and that attempts at reversal have varying degrees of success. These options include vasectomy reversal, sperm retrieval, and in vitro fertilization; these can be very expensive.   We discussed the chance of postvasectomy pain syndrome which occurs in less than 5% of patients. I explained to the patient that there is no treatment to resolve this chronic pain, and that if it developed I would not be able to help resolve the issue, but that surgery is generally not needed for correction.   I explained there have even been reports of systemic like illness associated with this chronic pain, and that there was no good cure. I explained that vasectomy it is not a 100%  reliable form of birth control, and the risk of pregnancy after vasectomy is approximately 1 in 2000 men who had a negative postvasectomy semen analysis or rare non-motile sperm.  I explained that repeat vasectomy was necessary in less than 1% of vasectomy procedures when employing the type of technique that is performed in the office. I explained that he should refrain from ejaculation for approximately one week following vasectomy. I explained that there are other options for birth control which are permanent and non-permanent; we discussed these.  I explained the rates of surgical complications, such as symptomatic hematoma or infection, are low (1-2%) and vary with the surgeon's experience and criteria used to diagnose the complication.     PMH: Past Medical History  Diagnosis Date  . Arthritis   . Sleep apnea     Surgical History: Past Surgical History  Procedure Laterality Date  . Sebasceous cyst  03/27/2007    Dr. Michela West  . S/p foot surgery  12/2002    Home Medications:    Medication List       This list is accurate as of: 11/04/15  4:04 PM.  Always use your most recent med list.               diazepam 10 MG tablet  Commonly known as:  VALIUM  Take 30 minutes prior to vasectomy     EPINEPHRINE (ANAPHYLAXIS THERAPY AGENTS)  EPIPEN, 0.3MG /0.3ML (Injection Device)  1 Device Use as directed, as needed for 0 days  Quantity: 2;  Refills: 5   Ordered :03-Apr-2011  Brett West, Brett ;  Started 03-Apr-2011 Active Comments: Medication taken as needed. Generic please.     fluticasone 50 MCG/ACT nasal spray  Commonly known as:  FLONASE  Place 2 sprays into both nostrils daily.     montelukast 10 MG tablet  Commonly known as:  SINGULAIR  Take 1 tablet (10 mg total) by mouth at bedtime.     testosterone 50 MG/5GM (1%) Gel  Commonly known as:  TESTIM  Place 5 g onto the skin daily.        Allergies: No Known Allergies  Family History: Family History  Problem Relation Age  of Onset  . Arthritis Father   . Kidney disease Neg Hx   . Prostate cancer Neg Hx     Social History:  reports that he quit smoking about 11 years ago. He does not have any smokeless tobacco history on file. He reports that he does not drink alcohol or use illicit drugs.  ROS: UROLOGY Frequent Urination?: No Hard to postpone urination?: No Burning/pain with urination?: No Get up at night to urinate?: No Leakage of urine?: No Urine stream starts and stops?: No Trouble starting stream?: No Do you have to strain to urinate?: No Blood in urine?: No Urinary tract infection?: No Sexually transmitted disease?: No Injury to kidneys or bladder?: No Painful intercourse?: No Weak stream?: No Erection problems?: No Penile pain?: No  Gastrointestinal Nausea?: No Vomiting?: No Indigestion/heartburn?: No Diarrhea?: No Constipation?: No  Constitutional Fever: No Night sweats?: No Weight loss?: No Fatigue?: No  Skin Skin rash/lesions?: No Itching?: No  Eyes Blurred vision?: No Double vision?: No  Ears/Nose/Throat Sore throat?: No Sinus problems?: No  Hematologic/Lymphatic Swollen glands?: No Easy bruising?: No  Cardiovascular Leg swelling?: No Chest pain?: No  Respiratory Cough?: No Shortness of breath?: No  Endocrine Excessive thirst?: No  Musculoskeletal Back pain?: No Joint pain?: No  Neurological Headaches?: No Dizziness?: No  Psychologic Depression?: No Anxiety?: No  Physical Exam: BP 130/78 mmHg  Pulse 76  Ht 6\' 2"  (1.88 m)  Wt 332 lb 11.2 oz (150.912 kg)  BMI 42.70 kg/m2  Constitutional: Well nourished. Alert and oriented, No acute distress. HEENT: [place] AT, moist mucus membranes. Trachea midline, no masses. Cardiovascular: No clubbing, cyanosis, or edema. Respiratory: Normal respiratory effort, no increased work of breathing. GI: Abdomen is soft, non tender, non distended, no abdominal masses. Liver and spleen not palpable.  No hernias  appreciated.  Stool sample for occult testing is not indicated.   GU: No CVA tenderness.  No bladder fullness or masses.  Patient with circumcised/uncircumcised phallus.   Urethral meatus is patent.  No penile discharge. No penile lesions or rashes. Scrotum without lesions, cysts, rashes and/or edema.  Testicles are located scrotally bilaterally. No masses are appreciated in the testicles. Left and right epididymis are normal. Rectal: Deferred.  Skin: No rashes, bruises or suspicious lesions. Lymph: No cervical or inguinal adenopathy. Neurologic: Grossly intact, no focal deficits, moving all 4 extremities. Psychiatric: Normal mood and affect.  Laboratory Data: Lab Results  Component Value Date   WBC 6.7 09/09/2015   HCT 43.9 09/09/2015   MCV 84 09/09/2015   PLT 219 09/09/2015    Lab Results  Component Value Date   CREATININE 1.13 09/09/2015     Lab Results  Component Value Date   TESTOSTERONE 229* 09/30/2015    Lab Results  Component Value Date   HGBA1C 5.8* 09/09/2015    Lab Results  Component Value Date   TSH 2.040  09/09/2015       Component Value Date/Time   CHOL 178 09/09/2015 1558   HDL 32* 09/09/2015 1558   CHOLHDL 5.6* 09/09/2015 1558   LDLCALC 75 09/09/2015 1558    Lab Results  Component Value Date   AST 29 09/09/2015   Lab Results  Component Value Date   ALT 41 09/09/2015     Assessment & Plan:    1. Vasectomy consult:  Patient has read and signed the consent.  He is given the pre-op vasectomy instruction sheet.  He is prescribed Valium 10 mg and instructed to take it 30 minutes prior to his vasectomy appointment.  He is to have a driver.  I reemphasized to the patient that this is to be considered a permanent form of birth control, that he is to use an alternative form of birth control until we receive the 3 months specimen and it is cleared of sperm and that this will not prevent STI's.  His questions are answered to his satisfaction and he  understands the risks and is willing to proceed with the vasectomy.  He will schedule his vasectomy.    I spent 30 minutes in a face-to-face conversation concerning the vasectomy procedure and pre-and post op expectations.  Greater than 50% was spent in counseling & coordination of care with the patient.   Return for Schedule vasectomy.  These notes generated with voice recognition software. I apologize for typographical errors.  Michiel Cowboy, PA-C  [facility] [address] [place], [place] [address] [phone]

## 2015-11-10 ENCOUNTER — Telehealth: Payer: Self-pay | Admitting: Emergency Medicine

## 2015-11-10 NOTE — Telephone Encounter (Signed)
Pt would like to know what the status is on his Cpap machine. Do you know anything about this or remember who he was suppose to see after she left? I don't see anything in the chart about his cpap. Thanks.

## 2015-11-11 ENCOUNTER — Telehealth: Payer: Self-pay | Admitting: Family Medicine

## 2015-11-11 NOTE — Telephone Encounter (Signed)
Sleep study on 09/21/2015 showed moderate obstructive sleep apnea. Order for CPAP signed by Dr. Elease HashimotoMaloney on 10/14/2015. Rosey Batheresa will be calling back from company to advise me of the status on this. Allene DillonEmily Drozdowski, CMA

## 2015-11-11 NOTE — Telephone Encounter (Signed)
Informed pt as below. Reed Eifert Drozdowski, CMA  

## 2015-11-11 NOTE — Telephone Encounter (Signed)
Brett West with [facility] calling stating she has forward everything to [facility] for pt CAP setup. CB# [phone]  Thanks CC

## 2015-11-23 ENCOUNTER — Encounter: Payer: Self-pay | Admitting: Family Medicine

## 2015-11-23 ENCOUNTER — Ambulatory Visit (INDEPENDENT_AMBULATORY_CARE_PROVIDER_SITE_OTHER): Payer: 59 | Admitting: Family Medicine

## 2015-11-23 VITALS — BP 128/80 | HR 76 | Temp 98.0°F | Resp 16 | Ht 73.5 in | Wt 335.0 lb

## 2015-11-23 DIAGNOSIS — Z0000 Encounter for general adult medical examination without abnormal findings: Secondary | ICD-10-CM | POA: Diagnosis not present

## 2015-11-23 DIAGNOSIS — Z23 Encounter for immunization: Secondary | ICD-10-CM | POA: Diagnosis not present

## 2015-11-23 DIAGNOSIS — R7303 Prediabetes: Secondary | ICD-10-CM | POA: Diagnosis not present

## 2015-11-23 DIAGNOSIS — E669 Obesity, unspecified: Secondary | ICD-10-CM | POA: Diagnosis not present

## 2015-11-23 DIAGNOSIS — R7989 Other specified abnormal findings of blood chemistry: Secondary | ICD-10-CM

## 2015-11-23 DIAGNOSIS — E291 Testicular hypofunction: Secondary | ICD-10-CM | POA: Diagnosis not present

## 2015-11-23 MED ORDER — LIRAGLUTIDE -WEIGHT MANAGEMENT 18 MG/3ML ~~LOC~~ SOPN: 2.4000 mg | PEN_INJECTOR | Freq: Every day | SUBCUTANEOUS | 6 refills | Status: DC

## 2015-11-23 NOTE — Progress Notes (Signed)
Patient: Brett West, Male    DOB: [birth year], 45 y.o.   MRN: [id number] Visit Date: 11/23/2015  Today's Provider: Mila Merry, MD   Chief Complaint  Patient presents with  . Annual Exam  . Hypogonadism    follow up low testosterone  . Sleep Apnea   Subjective:    Annual physical exam Brett West is a 45 y.o. male who presents today for health maintenance and complete physical. He feels fairly well. He reports never exercising. He reports he is sleeping poorly.  He had routine labs done by Dr. Elease Hashimoto in May which were remarkable for low testosterone as below, and mildly elevated fasting glucose of 120 and borderline elevated a1c=5.8  ----------------------------------------------------------------- Follow up Low Testosterone:  Patient was last seen for this problem 2 months ago. Changes made during that visit includes starting Testosterone supplements. Patient has not started using supplements due to cost.  Follow up Disordered Sleep:  Patient was last seen for this problem 2 months ago. Management during that visit includes ordering a hoe sleep test. Since the las visit, patient had a sleep study done on 09/21/2015 which showed moderate Obstructive sleep apnea. CPAP was ordered. Patient comes in today stating, he has not started the CPAP, because no one has called  Him to have this set up.   He would very much like to lose weight and incquires about medications to help with weight loss.   Review of Systems  Constitutional: Negative for appetite change, chills, fatigue and fever.  HENT: Negative for congestion, ear pain, hearing loss, nosebleeds and trouble swallowing.   Eyes: Negative for pain and visual disturbance.  Respiratory: Positive for apnea and wheezing. Negative for cough, chest tightness and shortness of breath.   Cardiovascular: Negative for chest pain, palpitations and leg swelling.  Gastrointestinal: Negative for abdominal pain, blood in stool,  constipation, diarrhea, nausea and vomiting.  Endocrine: Negative for polydipsia, polyphagia and polyuria.  Genitourinary: Negative for dysuria and flank pain.  Musculoskeletal: Negative for arthralgias, back pain, joint swelling, myalgias and neck stiffness.  Skin: Negative for color change, rash and wound.  Neurological: Negative for dizziness, tremors, seizures, speech difficulty, weakness, light-headedness and headaches.  Psychiatric/Behavioral: Negative for behavioral problems, confusion, decreased concentration, dysphoric mood and sleep disturbance. The patient is not nervous/anxious.   All other systems reviewed and are negative.   Social History      He  reports that he quit smoking about 11 years ago. He has never used smokeless tobacco. He reports that he does not drink alcohol or use drugs.       Social History   Social History  . Marital status: Married    Spouse name: N/A  . Number of children: 2  . Years of education: N/A   Social History Main Topics  . Smoking status: Former Smoker    Quit date: 04/30/2004  . Smokeless tobacco: Never Used  . Alcohol use No  . Drug use: No  . Sexual activity: Not Asked   Other Topics Concern  . None   Social History Narrative  . Works for [employer] [place]    Past Medical History:  Diagnosis Date  . Arthritis   . Sleep apnea      Patient Active Problem List   Diagnosis Date Noted  . Low testosterone 09/13/2015  . Allergic rhinitis 03/24/2015  . Allergy to yellow jackets 03/24/2015  . Allergic reaction to bee sting 03/24/2015  . Adiposity 03/24/2015  .  Arthritis, degenerative 03/24/2015  . Recurrent sinus infections 03/24/2015  . Disordered sleep 03/24/2015    Past Surgical History:  Procedure Laterality Date  . S/P foot surgery  12/2002  . Sebasceous cyst  03/27/2007   Dr. Michela Pitcher    Family History        Family Status  Relation Status  . Mother Alive  . Father Alive        His family history includes  Arthritis in his father.    No Known Allergies  Current Meds  Medication Sig  . EPINEPHRINE, ANAPHYLAXIS THERAPY AGENTS, EPIPEN, 0.3MG /0.3ML (Injection Device)  1 Device Use as directed, as needed for 0 days  Quantity: 2;  Refills: 5   Ordered :03-Apr-2011  Allene Dillon ;  Started 03-Apr-2011 Active Comments: Medication taken as needed. Generic please.  . fluticasone (FLONASE) 50 MCG/ACT nasal spray Place 2 sprays into both nostrils daily.  . montelukast (SINGULAIR) 10 MG tablet Take 1 tablet (10 mg total) by mouth at bedtime.    Patient Care Team: Malva Limes, MD as PCP - General (Family Medicine)     Objective:   Vitals: BP 128/80 (BP Location: Left Arm, Patient Position: Sitting, Cuff Size: Large)   Pulse 76   Temp 98 F (36.7 C) (Oral)   Resp 16   Ht 6' 1.5" (1.867 m)   Wt (!) 335 lb (152 kg)   SpO2 97% Comment: room air  BMI 43.60 kg/m    Physical Exam   General Appearance:    Alert, cooperative, no distress, appears stated age, morbidly obese  Head:    Normocephalic, without obvious abnormality, atraumatic  Eyes:    PERRL, conjunctiva/corneas clear, EOM's intact, fundi    benign, both eyes       Ears:    Normal TM's and external ear canals, both ears  Nose:   Nares normal, septum midline, mucosa normal, no drainage   or sinus tenderness  Throat:   Lips, mucosa, and tongue normal; teeth and gums normal  Neck:   Supple, symmetrical, trachea midline, no adenopathy;       thyroid:  No enlargement/tenderness/nodules; no carotid   bruit or JVD  Back:     Symmetric, no curvature, ROM normal, no CVA tenderness  Lungs:     Clear to auscultation bilaterally, respirations unlabored  Chest wall:    No tenderness or deformity  Heart:    Regular rate and rhythm, S1 and S2 normal, no murmur, rub   or gallop  Abdomen:     Soft, non-tender, bowel sounds active all four quadrants,    no masses, no organomegaly  Genitalia:    deferred  Rectal:    deferred    Extremities:   Extremities normal, atraumatic, no cyanosis or edema  Pulses:   2+ and symmetric all extremities  Skin:   Skin color, texture, turgor normal, no rashes or lesions  Lymph nodes:   Cervical, supraclavicular, and axillary nodes normal  Neurologic:   CNII-XII intact. Normal strength, sensation and reflexes      throughout    Depression Screen PHQ 2/9 Scores 11/23/2015  PHQ - 2 Score 0  PHQ- 9 Score 4      Assessment & Plan:     Routine Health Maintenance and Physical Exam  Exercise Activities and Dietary recommendations Goals    None      Immunization History  Administered Date(s) Administered  . Influenza,inj,Quad PF,36+ Mos 03/02/2015    Health Maintenance  Topic Date Due  .  HIV Screening  12/04/1985  . TETANUS/TDAP  12/04/1989  . INFLUENZA VACCINE  11/30/2015      Discussed health benefits of physical activity, and encouraged him to engage in regular exercise appropriate for his age and condition.    -------------------------------------------------------------------- 1. Annual physical exam   2. Obesity Long discussion with patient regarding relationship between obesity, sleep apnea, and low testosterone. Discusses health diet and exercise regiments. He is borderline pre-diabetes and and would likely benefit from GLP1 antogonist.  - Liraglutide -Weight Management (SAXENDA) 18 MG/3ML SOPN; Inject 2.4 mg into the skin daily. Start 0.6mg  daily for 1 week, then 1.2mg  daily 1 week, then 1.8mg  daily for 1 week, then 2.4mg  daily for 1 week, then 3.0mg  daily  Dispense: 15 mL; Refill: 6  3. Low testosterone Topical testosterone too expensive. Will hold for now.   4. Need for Tdap vaccination  - Tdap vaccine greater than or equal to 7yo IM  5. Prediabetes   Follow up in 3-4 months for BP check    Mila Merry, MD  [facility]

## 2015-11-23 NOTE — Patient Instructions (Signed)
   It is recommended to engage in 150 minutes of moderate exercise every week.    You can go to [email or web address] to apply for savings discount card for [facility]

## 2015-11-24 ENCOUNTER — Encounter: Payer: Self-pay | Admitting: Family Medicine

## 2015-11-30 ENCOUNTER — Encounter: Payer: Self-pay | Admitting: Family Medicine

## 2015-12-14 ENCOUNTER — Encounter: Payer: Self-pay | Admitting: Urology

## 2015-12-14 ENCOUNTER — Ambulatory Visit (INDEPENDENT_AMBULATORY_CARE_PROVIDER_SITE_OTHER): Payer: 59 | Admitting: Urology

## 2015-12-14 DIAGNOSIS — Z9852 Vasectomy status: Secondary | ICD-10-CM | POA: Insufficient documentation

## 2015-12-14 NOTE — Progress Notes (Signed)
12/14/2015 11:28 AM   Brett West 04/09/1971 [id number]  Referring provider: Lorie PhenixNancy Maloney, MD [address] [place], [place] [address]  No chief complaint on file.   HPI:  1 - Desire for Male Sterilization - Patient has 2 children, one son and one daughter, and wishes, both healthy. He has been considering vasectomy as means of permanent sterilization for at least 6 mos.   Today " Brett West" is seen to proceed with elective office vasectomy. His exam today however is quite UNfavorable. He has buried penis and cords that are very thick such that I cannot reliable separate vas from cord on exam.    PMH: Past Medical History:  Diagnosis Date  . Arthritis   . Sleep apnea     Surgical History: Past Surgical History:  Procedure Laterality Date  . PLANTAR FASCIECTOMY Right 04/2015  . S/P foot surgery  12/2002  . Sebasceous cyst  03/27/2007   Dr. Michela PitcherEly    Home Medications:    Medication List       Accurate as of 12/14/15 11:28 AM. Always use your most recent med list.          EPINEPHRINE (ANAPHYLAXIS THERAPY AGENTS) EPIPEN, 0.3MG /0.3ML (Injection Device)  1 Device Use as directed, as needed for 0 days  Quantity: 2;  Refills: 5   Ordered :03-Apr-2011  Allene Dillonrozdowski, Emily ;  Started 03-Apr-2011 Active Comments: Medication taken as needed. Generic please.   fluticasone 50 MCG/ACT nasal spray Commonly known as:  FLONASE Place 2 sprays into both nostrils daily.   Liraglutide -Weight Management 18 MG/3ML Sopn Commonly known as:  SAXENDA Inject 2.4 mg into the skin daily. Start 0.6mg  daily for 1 week, then 1.2mg  daily 1 week, then 1.8mg  daily for 1 week, then 2.4mg  daily for 1 week, then 3.0mg  daily   montelukast 10 MG tablet Commonly known as:  SINGULAIR Take 1 tablet (10 mg total) by mouth at bedtime.   testosterone 50 MG/5GM (1%) Gel Commonly known as:  TESTIM Place 5 g onto the skin daily.       Allergies: No Known Allergies  Family History: Family  History  Problem Relation Age of Onset  . Arthritis Father   . Kidney disease Neg Hx   . Prostate cancer Neg Hx     Social History:  reports that he quit smoking about 11 years ago. He has never used smokeless tobacco. He reports that he does not drink alcohol or use drugs.    Review of Systems  Gastrointestinal (upper)  : Negative for upper GI symptoms  Gastrointestinal (lower) : Negative for lower GI symptoms  Constitutional : Negative for symptoms  Skin: Negative for skin symptoms  Eyes: Negative for eye symptoms  Ear/Nose/Throat : Negative for Ear/Nose/Throat symptoms  Hematologic/Lymphatic: Negative for Hematologic/Lymphatic symptoms  Cardiovascular : Negative for cardiovascular symptoms  Respiratory : Negative for respiratory symptoms  Endocrine: Negative for endocrine symptoms  Musculoskeletal: Negative for musculoskeletal symptoms  Neurological: Negative for neurological symptoms  Psychologic: Negative for psychiatric symptoms   Physical Exam: There were no vitals taken for this visit.  Constitutional:  Alert and oriented, No acute distress. HEENT: [place] AT, moist mucus membranes.  Trachea midline, no masses. Cardiovascular: No clubbing, cyanosis, or edema. Respiratory: Normal respiratory effort, no increased work of breathing. GI: Abdomen is soft, nontender, nondistended, no abdominal masses GU: No CVA tenderness. Vas palpable bilaterally  Skin: No rashes, bruises or suspicious lesions. Lymph: No cervical or inguinal adenopathy. Neurologic: Grossly intact, no focal  deficits, moving all 4 extremities. Psychiatric: Normal mood and affect.   Urinalysis No results found for: COLORURINE, APPEARANCEUR, LABSPEC, PHURINE, GLUCOSEU, HGBUR, BILIRUBINUR, KETONESUR, PROTEINUR, UROBILINOGEN, NITRITE, LEUKOCYTESUR   Assessment & Plan:    1 - Desire for Male Sterilization - I have recommended vasectomy under anesthesia as safest way forward. Risks,  benefits, alternatives, expected peri-op course discussed. Pt voiced understanding and desire to proceed.   Sebastian AcheMANNY, Dhruv Christina, MD  [facility] [address], [address] [place], [place] [address] [phone]

## 2015-12-15 ENCOUNTER — Telehealth: Payer: Self-pay | Admitting: Radiology

## 2015-12-15 ENCOUNTER — Other Ambulatory Visit: Payer: Self-pay | Admitting: Radiology

## 2015-12-15 DIAGNOSIS — Z302 Encounter for sterilization: Secondary | ICD-10-CM

## 2015-12-15 NOTE — Telephone Encounter (Signed)
Notified pt of surgery scheduled with Dr Apolinar JunesBrandon on 02/14/16, pre-admit testing appt on 02/04/16 @7 :30 & to call Friday prior to surgery for arrival time to SDS. Pt voices understanding.

## 2015-12-20 ENCOUNTER — Telehealth: Payer: Self-pay | Admitting: Family Medicine

## 2015-12-20 NOTE — Telephone Encounter (Signed)
Pt stated that he still hasn't heard anything about getting his CPAP machine that he was supposed to get over 2 months ago. Pt would like a nurse to return his call to update about getting the CPAP machine and supplies. Please advise. Thanks TNP

## 2015-12-21 NOTE — Telephone Encounter (Signed)
After reviewing his chart, it looks like the CPAP order was faxed to [facility] on 10/14/2015.  I tried contacting [name] from [facility] however she is not in the office until 11AM 12/21/2015.  I will try to call again later.   Thanks,   -Vernona RiegerLaura

## 2015-12-21 NOTE — Telephone Encounter (Signed)
I spoke with [name] with [facility].  She reported that her Company spoke with [name] and scheduled an appointment for 10/26/2015 at 1pm.  He did not show and they called and mailed a letter to his home to reschedule.  She says they have not heard back from him.    I advised [name] he does not recall the appointment and says he was in [place] at that time.  I gave him the number to contact [facility] to reschedule the appointment.    Thanks,   -Vernona RiegerLaura

## 2016-01-24 ENCOUNTER — Telehealth: Payer: Self-pay | Admitting: Family Medicine

## 2016-01-24 NOTE — Telephone Encounter (Signed)
Pt called about CPAP machine. I asked pt if he had rescheduled appt with [facility]. Pt stated no b/c when he spoke with someone in our office and was advised that we schedule that. Pt stated that he didn't know who he spoke with last week. I asked pt to hold while I tried to see if one of our nurses were working on this for pt. Pt hung up. Please advise. Thanks TNP

## 2016-01-25 NOTE — Telephone Encounter (Signed)
Patient advised and verbally voiced understanding.  

## 2016-01-25 NOTE — Telephone Encounter (Signed)
I spoke with Brett West (in referrals) and she states she was the one who talked to the patient last week. She states she faxed the information and order yesterday to [facility] because she was not here on last Friday. [facility] should contact patient. Patient can call [facility] at [phone] for an update status. Tried calling patient. Left message to call back.

## 2016-02-04 ENCOUNTER — Encounter
Admission: RE | Admit: 2016-02-04 | Discharge: 2016-02-04 | Disposition: A | Discharge status: Home / Self Care | Payer: 59 | Source: Ambulatory Visit | Attending: Urology | Admitting: Urology

## 2016-02-04 DIAGNOSIS — Z302 Encounter for sterilization: Secondary | ICD-10-CM | POA: Insufficient documentation

## 2016-02-04 LAB — SURGICAL PCR SCREEN
MRSA, PCR: NEGATIVE
Staphylococcus aureus: NEGATIVE

## 2016-02-04 NOTE — Patient Instructions (Signed)
  Your procedure is scheduled ZO:XWRUEAon:Monday Oct.16 , 2017. Report to Same Day Surgery. To find out your arrival time please call [phone] between 1PM - 3PM on Friday Oct.13. 2017 .  Remember: Instructions that are not followed completely may result in serious medical risk, up to and including death, or upon the discretion of your surgeon and anesthesiologist your surgery may need to be rescheduled.    _x___ 1. Do not eat food or drink liquids after midnight. No gum chewing or hard candies.     ____ 2. No Alcohol for 24 hours before or after surgery.   ____ 3. Bring all medications with you on the day of surgery if instructed.    __x__ 4. Notify your doctor if there is any change in your medical condition     (cold, fever, infections).    _____ 5. No smoking 24 hours prior to surgery.     Do not wear jewelry, make-up, hairpins, clips or nail polish.  Do not wear lotions, powders, or perfumes.   Do not shave 48 hours prior to surgery. Men may shave face and neck.  Do not bring valuables to the hospital.    [facility] is not responsible for any belongings or valuables.               Contacts, dentures or bridgework may not be worn into surgery.  Leave your suitcase in the car. After surgery it may be brought to your room.  For patients admitted to the hospital, discharge time is determined by your treatment team.   Patients discharged the day of surgery will not be allowed to drive home.    Please read over the following fact sheets that you were given:   [facility] Preparing [facility]  ____ Take these medicines the morning of surgery with A SIP OF WATER: none     ____ Fleet Enema (as directed)   _x___ Use CHG Soap as directed on instruction sheet  ____ Use inhalers on the day of surgery and bring to hospital day of surgery  ____ Stop metformin 2 days prior to surgery    ____ Take 1/2 of usual insulin dose the night before surgery and none on the morning of surgery.    ____ Stop Coumadin/Plavix/aspirin on does not apply.  _x__ Stop Anti-inflammatories such as Advil, Aleve, Ibuprofen, Motrin, Naproxen,  Naprosyn, Goodies powders or aspirin products. OK to take Tylenol.   ____ Stop supplements until after surgery.    _x___ Bring C-Pap to the hospital.

## 2016-02-14 ENCOUNTER — Ambulatory Visit: Payer: 59 | Admitting: Anesthesiology

## 2016-02-14 ENCOUNTER — Encounter: Payer: Self-pay | Admitting: Anesthesiology

## 2016-02-14 ENCOUNTER — Encounter
Admission: RE | Disposition: A | Discharge status: Home / Self Care | Payer: Self-pay | Source: Ambulatory Visit | Attending: Urology

## 2016-02-14 ENCOUNTER — Ambulatory Visit
Admission: RE | Admit: 2016-02-14 | Discharge: 2016-02-14 | Disposition: A | Discharge status: Home / Self Care | Payer: 59 | Source: Ambulatory Visit | Attending: Urology | Admitting: Urology

## 2016-02-14 DIAGNOSIS — Z7951 Long term (current) use of inhaled steroids: Secondary | ICD-10-CM | POA: Insufficient documentation

## 2016-02-14 DIAGNOSIS — Z302 Encounter for sterilization: Secondary | ICD-10-CM | POA: Diagnosis not present

## 2016-02-14 DIAGNOSIS — G4730 Sleep apnea, unspecified: Secondary | ICD-10-CM | POA: Insufficient documentation

## 2016-02-14 DIAGNOSIS — Z87891 Personal history of nicotine dependence: Secondary | ICD-10-CM | POA: Insufficient documentation

## 2016-02-14 DIAGNOSIS — M1990 Unspecified osteoarthritis, unspecified site: Secondary | ICD-10-CM | POA: Diagnosis not present

## 2016-02-14 DIAGNOSIS — Z9989 Dependence on other enabling machines and devices: Secondary | ICD-10-CM | POA: Diagnosis not present

## 2016-02-14 HISTORY — PX: VASECTOMY: SHX75

## 2016-02-14 SURGERY — VASECTOMY
Anesthesia: General | Wound class: Clean Contaminated

## 2016-02-14 MED ORDER — KETOROLAC TROMETHAMINE 30 MG/ML IJ SOLN
INTRAMUSCULAR | Status: DC | PRN
  Administered 2016-02-14: 30 mg via INTRAVENOUS

## 2016-02-14 MED ORDER — ONDANSETRON HCL 4 MG/2ML IJ SOLN
INTRAMUSCULAR | Status: DC | PRN
  Administered 2016-02-14: 4 mg via INTRAVENOUS

## 2016-02-14 MED ORDER — CLINDAMYCIN PHOSPHATE 900 MG/50ML IV SOLN
900.0000 mg | INTRAVENOUS | Status: AC
  Administered 2016-02-14: 900 mg via INTRAVENOUS

## 2016-02-14 MED ORDER — FAMOTIDINE 20 MG PO TABS
20.0000 mg | ORAL_TABLET | Freq: Once | ORAL | Status: AC
  Administered 2016-02-14: 20 mg via ORAL

## 2016-02-14 MED ORDER — FAMOTIDINE 20 MG PO TABS
ORAL_TABLET | ORAL | Status: AC
  Filled 2016-02-14: qty 1

## 2016-02-14 MED ORDER — ONDANSETRON HCL 4 MG/2ML IJ SOLN: 4.0000 mg | Freq: Once | INTRAMUSCULAR | Status: DC | PRN

## 2016-02-14 MED ORDER — FENTANYL CITRATE (PF) 100 MCG/2ML IJ SOLN: 25.0000 ug | INTRAMUSCULAR | Status: DC | PRN

## 2016-02-14 MED ORDER — CLINDAMYCIN PHOSPHATE 900 MG/50ML IV SOLN
INTRAVENOUS | Status: AC
  Filled 2016-02-14: qty 50

## 2016-02-14 MED ORDER — PROPOFOL 500 MG/50ML IV EMUL
INTRAVENOUS | Status: DC | PRN
  Administered 2016-02-14: 200 ug/kg/min via INTRAVENOUS

## 2016-02-14 MED ORDER — BUPIVACAINE HCL (PF) 0.5 % IJ SOLN
INTRAMUSCULAR | Status: AC
  Filled 2016-02-14: qty 30

## 2016-02-14 MED ORDER — LACTATED RINGERS IV SOLN
INTRAVENOUS | Status: DC
  Administered 2016-02-14: 09:00:00 via INTRAVENOUS

## 2016-02-14 MED ORDER — FENTANYL CITRATE (PF) 100 MCG/2ML IJ SOLN
INTRAMUSCULAR | Status: DC | PRN
  Administered 2016-02-14: 25 ug via INTRAVENOUS

## 2016-02-14 MED ORDER — MIDAZOLAM HCL 2 MG/2ML IJ SOLN
INTRAMUSCULAR | Status: DC | PRN
  Administered 2016-02-14: 2 mg via INTRAVENOUS

## 2016-02-14 MED ORDER — BUPIVACAINE HCL 0.5 % IJ SOLN
INTRAMUSCULAR | Status: DC | PRN
  Administered 2016-02-14: 8 mL

## 2016-02-14 SURGICAL SUPPLY — 38 items
BLADE CLIPPER SURG (BLADE) ×2 IMPLANT
BLADE SURG 15 STRL LF DISP TIS (BLADE) ×1 IMPLANT
BLADE SURG 15 STRL SS (BLADE) ×1
CANISTER SUCT 1200ML W/VALVE (MISCELLANEOUS) ×2 IMPLANT
CHLORAPREP W/TINT 26ML (MISCELLANEOUS) ×2 IMPLANT
DERMABOND ADVANCED (GAUZE/BANDAGES/DRESSINGS) ×1
DERMABOND ADVANCED .7 DNX12 (GAUZE/BANDAGES/DRESSINGS) ×1 IMPLANT
DRAPE LAPAROTOMY 100X77 ABD (DRAPES) ×2 IMPLANT
DRAPE LAPAROTOMY 77X122 PED (DRAPES) ×2 IMPLANT
DRESSING TELFA 4X3 1S ST N-ADH (GAUZE/BANDAGES/DRESSINGS) ×2 IMPLANT
ELECT CAUTERY NEEDLE TIP 1.0 (MISCELLANEOUS) ×2
ELECT REM PT RETURN 9FT ADLT (ELECTROSURGICAL) ×2
ELECTRODE CAUTERY NEDL TIP 1.0 (MISCELLANEOUS) ×1 IMPLANT
ELECTRODE REM PT RTRN 9FT ADLT (ELECTROSURGICAL) ×1 IMPLANT
GAUZE FLUFF 18X24 1PLY STRL (GAUZE/BANDAGES/DRESSINGS) ×2 IMPLANT
GAUZE SPONGE 4X4 12PLY STRL (GAUZE/BANDAGES/DRESSINGS) ×2 IMPLANT
GLOVE BIO SURGEON STRL SZ 6.5 (GLOVE) ×2 IMPLANT
GOWN STRL REUS W/ TWL LRG LVL3 (GOWN DISPOSABLE) ×2 IMPLANT
GOWN STRL REUS W/TWL LRG LVL3 (GOWN DISPOSABLE) ×2
KIT RM TURNOVER STRD PROC AR (KITS) ×2 IMPLANT
LABEL OR SOLS (LABEL) ×2 IMPLANT
LIQUID BAND (GAUZE/BANDAGES/DRESSINGS) IMPLANT
NDL SAFETY 22GX1.5 (NEEDLE) ×2 IMPLANT
NEEDLE HYPO 22GX1.5 SAFETY (NEEDLE) ×2 IMPLANT
NEEDLE HYPO 25X1 1.5 SAFETY (NEEDLE) ×2 IMPLANT
NS IRRIG 500ML POUR BTL (IV SOLUTION) ×2 IMPLANT
PACK BASIN MINOR ARMC (MISCELLANEOUS) ×2 IMPLANT
PREP PVP WINGED SPONGE (MISCELLANEOUS) ×2 IMPLANT
SUCTION FRAZIER HANDLE 10FR (MISCELLANEOUS) ×1
SUCTION TUBE FRAZIER 10FR DISP (MISCELLANEOUS) ×1 IMPLANT
SUPPORETR ATHLETIC LG (MISCELLANEOUS) ×1 IMPLANT
SUPPORTER ATHLETIC LG (MISCELLANEOUS) ×2
SUT CHROMIC 3 0 PS 2 (SUTURE) ×2 IMPLANT
SUT CHROMIC 4 0 RB 1X27 (SUTURE) ×2 IMPLANT
SUT VIC AB 3-0 SH 27 (SUTURE)
SUT VIC AB 3-0 SH 27X BRD (SUTURE) IMPLANT
SYRINGE 10CC LL (SYRINGE) ×2 IMPLANT
WATER STERILE IRR 1000ML POUR (IV SOLUTION) IMPLANT

## 2016-02-14 NOTE — Transfer of Care (Signed)
Immediate Anesthesia Transfer of Care Note  Patient: Brett West  Procedure(s) Performed: Procedure(s): VASECTOMY (N/A)  Patient Location: PACU  Anesthesia Type:General  Level of Consciousness: responds to stimulation  Airway & Oxygen Therapy: Patient Spontanous Breathing and Patient connected to nasal cannula oxygen  Post-op Assessment: Report given to RN and Post -op Vital signs reviewed and stable  Post vital signs: Reviewed and stable  Last Vitals:  Vitals:   02/14/16 0818 02/14/16 0950  BP: (!) 113/99 107/71  Pulse: 69 76  Resp: 18 20  Temp: 36.5 C 36.8 C    Last Pain:  Vitals:   02/14/16 0950  TempSrc:   PainSc: Asleep         Complications: No apparent anesthesia complications

## 2016-02-14 NOTE — H&P (Signed)
12/14/2015 --> updated on AM of 02/14/16 without changes.  RRR.  CTAB. 11:28 AM   Brett West 07/20/1970 [id number]  Referring provider: Lorie PhenixNancy Maloney, MD [address] [place], [place] [address]  No chief complaint on file.   HPI:  1 - Desire for Male Sterilization - Patient has 2 children, one son and one daughter, and wishes, both healthy. He has been considering vasectomy as means of permanent sterilization for at least 6 mos.   Today " Brett West" is seen to proceed with elective office vasectomy. His exam today however is quite UNfavorable. He has buried penis and cords that are very thick such that I cannot reliable separate vas from cord on exam.    PMH:     Past Medical History:  Diagnosis Date  . Arthritis   . Sleep apnea     Surgical History:      Past Surgical History:  Procedure Laterality Date  . PLANTAR FASCIECTOMY Right 04/2015  . S/P foot surgery  12/2002  . Sebasceous cyst  03/27/2007   Dr. Michela PitcherEly    Home Medications:        Medication List           Accurate as of 12/14/15 11:28 AM. Always use your most recent med list.           EPINEPHRINE (ANAPHYLAXIS THERAPY AGENTS) EPIPEN, 0.3MG /0.3ML (Injection Device)  1 Device Use as directed, as needed for 0 days  Quantity: 2;  Refills: 5   Ordered :03-Apr-2011  Brett West, Emily ;  Started 03-Apr-2011 Active Comments: Medication taken as needed. Generic please.  fluticasone 50 MCG/ACT nasal spray Commonly known as:  FLONASE Place 2 sprays into both nostrils daily.  Liraglutide -Weight Management 18 MG/3ML Sopn Commonly known as:  SAXENDA Inject 2.4 mg into the skin daily. Start 0.6mg  daily for 1 week, then 1.2mg  daily 1 week, then 1.8mg  daily for 1 week, then 2.4mg  daily for 1 week, then 3.0mg  daily  montelukast 10 MG tablet Commonly known as:  SINGULAIR Take 1 tablet (10 mg total) by mouth at bedtime.  testosterone 50 MG/5GM (1%) Gel Commonly known as:   TESTIM Place 5 g onto the skin daily.      Allergies: No Known Allergies  Family History:      Family History  Problem Relation Age of Onset  . Arthritis Father   . Kidney disease Neg Hx   . Prostate cancer Neg Hx     Social History:  reports that he quit smoking about 11 years ago. He has never used smokeless tobacco. He reports that he does not drink alcohol or use drugs.    Review of Systems  Gastrointestinal (upper)  : Negative for upper GI symptoms  Gastrointestinal (lower) : Negative for lower GI symptoms  Constitutional : Negative for symptoms  Skin: Negative for skin symptoms  Eyes: Negative for eye symptoms  Ear/Nose/Throat : Negative for Ear/Nose/Throat symptoms  Hematologic/Lymphatic: Negative for Hematologic/Lymphatic symptoms  Cardiovascular : Negative for cardiovascular symptoms  Respiratory : Negative for respiratory symptoms  Endocrine: Negative for endocrine symptoms  Musculoskeletal: Negative for musculoskeletal symptoms  Neurological: Negative for neurological symptoms  Psychologic: Negative for psychiatric symptoms   Physical Exam: There were no vitals taken for this visit.  Constitutional:  Alert and oriented, No acute distress. HEENT: [place] AT, moist mucus membranes.  Trachea midline, no masses. Cardiovascular: No clubbing, cyanosis, or edema. Respiratory: Normal respiratory effort, no increased work of breathing. GI: Abdomen is soft, nontender,  nondistended, no abdominal masses GU: No CVA tenderness. Vas palpable bilaterally  Skin: No rashes, bruises or suspicious lesions. Lymph: No cervical or inguinal adenopathy. Neurologic: Grossly intact, no focal deficits, moving all 4 extremities. Psychiatric: Normal mood and affect.   Urinalysis Labs(Brief)  No results found for: COLORURINE, APPEARANCEUR, LABSPEC, PHURINE, GLUCOSEU, HGBUR, BILIRUBINUR, KETONESUR, PROTEINUR, UROBILINOGEN, NITRITE,  LEUKOCYTESUR     Assessment & Plan:    1 - Desire for Male Sterilization - I have recommended vasectomy under anesthesia as safest way forward. Risks, benefits, alternatives, expected peri-op course discussed. Pt voiced understanding and desire to proceed.   Brett Ache, MD  [facility] [address], [address] [place], [place] [address] [phone]     Electronically signed by Brett Ache, MD at 12/14/2015 4:26 PM      Procedure visit on 12/14/2015

## 2016-02-14 NOTE — Discharge Instructions (Signed)
Vasectomy                                    Patient Education and Post Care   If you were provided a sedative (Valium) you must have someone available to drive you home after your procedure. Avoid strenuous activity for 48 hours after your procedure. This includes any heavy lifting. You may return to work the next day, as long as no strenuous activity is required. Leave bandage in place for the next 24hrs. If you have any difficulty urinating remove you may remove bandage earlier. Then keep area clean and dry. You may shower after 24 hours. Do not take a bath or use a hot tub for five (5) days. You may apply ice or a cold pack to the scrotum as needed, for 10 minutes of every hours. (A bag of frozen peas will mold to the area). This may help reduce any pain or swelling. It may also be helpful to wear supportive underwear, such as briefs. Expect some mild pain, mild swelling of the scrotum and possible slight fluid leak from the site of the puncture. A gauze pad may be applied to the scrotum if there is any leakage from the puncture site. This drainage may continue for several days and is normal. You may continue your normal diet. After the procedure, you will be given 1-2 specimen cups. You need to do sperm checks at 6 and/or 12 weeks. Please bring the specimen back to our office to be sent out for analysis. You may resume having sex 1 week after procedure, if comfortable enough. Until you are told that you are sterile, it is essential that you use another form of birth control until otherwise notified by our office. Take Extra-Strength Tylenol, Motrin or Advil as needed for any pain or discomfort. Follow the package directions regarding dose. Stronger prescription pain medication is provided, but most people do not find that it is necessary. If you experience unusual or severe pain that is not relieved by pain medication, excessive bleeding  or drainage, excessive swelling or redness, foul odor or a fever over 101 F, please contact our office.  [facility] [address], [address] [place], [place] [address] [phone]   AMBULATORY SURGERY  DISCHARGE INSTRUCTIONS   1) The drugs that you were given will stay in your system until tomorrow so for the next 24 hours you should not:  A) Drive an automobile B) Make any legal decisions C) Drink any alcoholic beverage   2) You may resume regular meals tomorrow.  Today it is better to start with liquids and gradually work up to solid foods.  You may eat anything you prefer, but it is better to start with liquids, then soup and crackers, and gradually work up to solid foods.   3) Please notify your doctor immediately if you have any unusual bleeding, trouble breathing, redness and pain at the surgery site, drainage, fever, or pain not relieved by medication.    4) Additional Instructions:        Please contact your physician  with any problems or Same Day Surgery at [phone], Monday through Friday 6 am to 4 pm, or [place] at [facility] number at [phone].

## 2016-02-14 NOTE — Op Note (Signed)
Date of procedure: 02/14/16  Preoperative diagnosis:  1. Desires vasectomy  Postoperative diagnosis:  1. same   Procedure: 1. vasectomy  Surgeon: Vanna ScotlandAshley Ruble Buttler, MD  Anesthesia: MAC + local  Complications: None  Intraoperative findings: Thickened cords but ultimately successfully isolating vasa  EBL: minimal  Specimens: bilateral vasa  Drains: none  Indication: Brett CurbJames T Curling is a 45 y.o. patient desiring vasectomy.  After reviewing the management options for treatment, he elected to proceed with the above surgical procedure(s). We have discussed the potential benefits and risks of the procedure, side effects of the proposed treatment, the likelihood of the patient achieving the goals of the procedure, and any potential problems that might occur during the procedure or recuperation. Informed consent has been obtained.  Description of procedure:  The patient was taken to the operating room and MAC was administed induced.  He was prepped and draped in the usual sterile fashion, and preoperative antibiotics were administered. A preoperative time-out was performed.   @TODAY @  CC: No chief complaint on file.   HPI:  Blood pressure 107/71, pulse 76, temperature 98.2 F (36.8 C), resp. rate 20, height 6\' 2"  (1.88 m), weight (!) 328 lb (148.8 kg), SpO2 95 %. NED. A&Ox3.   No respiratory distress   Abd soft, NT, ND Normal external genitalia with patent urethral meatus  - The vas was palpated through the scrotal skin on the left. - 1% marcaine was injected into the skin and surrounding tissue for placement  - In a similar manner, the vas on the right was identified, anesthetized, and stabilized. - A #15 blade was used to open the overlying skin - The left vas was isolated and brought up through the incision exposing that structure. - Bleeding points were cauterized as they occurred. - The vas was free from the surrounding structures and brought to the view. - A segment  was positioned for placement with a hemostat. - A second hemostat was placed and a small segment between the two hemostats and was removed for inspection. - Each end of the transected vas lumen was fulgurated/ obliterated using needlepoint electrocautery -A fascial interposition was performed on testicular end of the vas using #3-0 chromic suture -The same procedure was performed on the right. - A single suture of #3-0 chromic catgut was used to close each lateral scrotal skin incision - A dressing was applied.  Post-Procedure: - Patient was instructed in care of the operative area - A specimen is to be delivered in 12 weeks   -Another form of contraception is to be used until post vasectomy semen analysis  Vanna ScotlandAshley [name], MD  Vanna ScotlandAshley [name], M.D.

## 2016-02-14 NOTE — Anesthesia Procedure Notes (Signed)
Procedure Name: MAC Date/Time: 02/14/2016 9:18 AM Performed by: Marlana SalvageJESSUP, Tenecia Ignasiak Pre-anesthesia Checklist: Patient identified, Emergency Drugs available, Suction available and Patient being monitored Patient Re-evaluated:Patient Re-evaluated prior to inductionOxygen Delivery Method: Nasal cannula Intubation Type: IV induction Ventilation: Oral airway inserted - appropriate to patient size Airway Equipment and Method: Oral airway Dental Injury: Teeth and Oropharynx as per pre-operative assessment  Comments: Oral airway placed easily.  Patent airway maintained.

## 2016-02-14 NOTE — Anesthesia Preprocedure Evaluation (Addendum)
Anesthesia Evaluation  Patient identified by MRN, date of birth, ID band Patient awake    Reviewed: Allergy & Precautions, NPO status , Patient's Chart, lab work & pertinent test results  Airway Mallampati: II  TM Distance: >3 FB     Dental no notable dental hx. (+) Chipped   Pulmonary sleep apnea and Continuous Positive Airway Pressure Ventilation , former smoker,    Pulmonary exam normal        Cardiovascular negative cardio ROS Normal cardiovascular exam     Neuro/Psych negative neurological ROS  negative psych ROS   GI/Hepatic negative GI ROS, Neg liver ROS,   Endo/Other  negative endocrine ROS  Renal/GU negative Renal ROS  negative genitourinary   Musculoskeletal  (+) Arthritis , Osteoarthritis,  Plantar fasciectomy Hx   Abdominal Normal abdominal exam  (+)   Peds negative pediatric ROS (+)  Hematology negative hematology ROS (+)   Anesthesia Other Findings   Reproductive/Obstetrics                            Anesthesia Physical Anesthesia Plan  ASA: III  Anesthesia Plan: General and MAC   Post-op Pain Management:    Induction: Intravenous  Airway Management Planned: Nasal Cannula  Additional Equipment:   Intra-op Plan:   Post-operative Plan: Extubation in OR  Informed Consent: I have reviewed the patients History and Physical, chart, labs and discussed the procedure including the risks, benefits and alternatives for the proposed anesthesia with the patient or authorized representative who has indicated his/her understanding and acceptance.   Dental advisory given  Plan Discussed with: CRNA and Surgeon  Anesthesia Plan Comments:        Anesthesia Quick Evaluation

## 2016-02-15 ENCOUNTER — Encounter: Payer: Self-pay | Admitting: Urology

## 2016-02-15 LAB — SURGICAL PATHOLOGY

## 2016-02-15 NOTE — Anesthesia Postprocedure Evaluation (Signed)
Anesthesia Post Note  Patient: Brett West  Procedure(s) Performed: Procedure(s) (LRB): VASECTOMY (N/A)  Patient location during evaluation: PACU Anesthesia Type: General Level of consciousness: awake and alert and oriented Pain management: pain level controlled Vital Signs Assessment: post-procedure vital signs reviewed and stable Respiratory status: spontaneous breathing Cardiovascular status: blood pressure returned to baseline Anesthetic complications: no    Last Vitals:  Vitals:   02/14/16 1113 02/14/16 1133  BP: 126/61 127/76  Pulse: (!) 59 (!) 55  Resp: 16 16  Temp: 36.6 C 36.3 C    Last Pain:  Vitals:   02/15/16 0856  TempSrc:   PainSc: 1                  Cali Cuartas

## 2016-02-22 ENCOUNTER — Ambulatory Visit (INDEPENDENT_AMBULATORY_CARE_PROVIDER_SITE_OTHER): Payer: 59 | Admitting: Urology

## 2016-02-22 VITALS — BP 149/83 | HR 88 | Temp 98.3°F | Ht 74.0 in | Wt 325.0 lb

## 2016-02-22 DIAGNOSIS — Z9852 Vasectomy status: Secondary | ICD-10-CM

## 2016-02-22 NOTE — Progress Notes (Signed)
02/22/2016 11:43 AM   Brett West 04/17/1971 [id number]  Referring provider: Malva Limesonald E Fisher, MD [address] [place], [place] [id number]  Chief Complaint  Patient presents with  . Post-op Problem    post vas infection?    HPI: 45 year old male who presents to office today for further evaluation of possible vasectomy site infection.  He notes today that he has significantly more discomfort on the right side compared to the left. In addition, he notes that the right-sided incision has opened somewhat and has a clearish substance on the surface.  He denies any fevers chills.  He underwent vasectomy in the operating room on 02/14/2016 due to his habitus making vasectomy slightly more technically difficult. Procedure was uncomplicated.  He is prediabetic, slightly elevated hemoglobin A1c.   PMH: Past Medical History:  Diagnosis Date  . Sleep apnea     Surgical History: Past Surgical History:  Procedure Laterality Date  . PLANTAR FASCIECTOMY Right 04/2015  . S/P foot surgery  12/2002  . Sebasceous cyst  03/27/2007   Dr. Michela PitcherEly  . VASECTOMY N/A 02/14/2016   Procedure: VASECTOMY;  Surgeon: Vanna ScotlandAshley Clema Skousen, MD;  Location: [facility] ORS;  Service: Urology;  Laterality: N/A;    Home Medications:    Medication List       Accurate as of 02/22/16 11:59 PM. Always use your most recent med list.          EPINEPHRINE (ANAPHYLAXIS THERAPY AGENTS) EPIPEN, 0.3MG /0.3ML (Injection Device)  1 Device Use as directed, as needed for 0 days  Quantity: 2;  Refills: 5   Ordered :03-Apr-2011  Allene Dillonrozdowski, Emily ;  Started 03-Apr-2011 Active Comments: Medication taken as needed. Generic please.   fluticasone 50 MCG/ACT nasal spray Commonly known as:  FLONASE Place 2 sprays into both nostrils daily.   montelukast 10 MG tablet Commonly known as:  SINGULAIR Take 1 tablet (10 mg total) by mouth at bedtime.       Allergies:  Allergies  Allergen Reactions  . Bee Venom Swelling     Family History: Family History  Problem Relation Age of Onset  . Arthritis Father   . Kidney disease Neg Hx   . Prostate cancer Neg Hx     Social History:  reports that he quit smoking about 11 years ago. He has never used smokeless tobacco. He reports that he does not drink alcohol or use drugs.  ROS: UROLOGY Frequent Urination?: No Hard to postpone urination?: No Burning/pain with urination?: No Get up at night to urinate?: No Leakage of urine?: No Urine stream starts and stops?: No Trouble starting stream?: No Do you have to strain to urinate?: No Blood in urine?: No Urinary tract infection?: No Sexually transmitted disease?: No Injury to kidneys or bladder?: No Painful intercourse?: No Weak stream?: No Erection problems?: No Penile pain?: No  Gastrointestinal Nausea?: No Vomiting?: No Indigestion/heartburn?: No Diarrhea?: No Constipation?: No  Constitutional Fever: No Night sweats?: No Weight loss?: No Fatigue?: No  Skin Skin rash/lesions?: No Itching?: No  Eyes Blurred vision?: No Double vision?: No  Ears/Nose/Throat Sore throat?: No Sinus problems?: No  Hematologic/Lymphatic Swollen glands?: No Easy bruising?: No  Cardiovascular Leg swelling?: No Chest pain?: No  Respiratory Cough?: No Shortness of breath?: No  Endocrine Excessive thirst?: No  Musculoskeletal Back pain?: No Joint pain?: No  Neurological Headaches?: No Dizziness?: No  Psychologic Depression?: No Anxiety?: No  Physical Exam: BP (!) 149/83   Pulse 88   Temp 98.3 F (36.8 C)  Ht 6\' 2"  (1.88 m)   Wt (!) 325 lb (147.4 kg)   BMI 41.73 kg/m   Constitutional:  Alert and oriented, No acute distress. HEENT: [place] AT, moist mucus membranes.  Trachea midline, no masses. Cardiovascular: No clubbing, cyanosis, or edema. Respiratory: Normal respiratory effort, no increased work of breathing. GI: Abdomen is soft, nontender, nondistended, no abdominal masses.   Obese. GU: Bilateral descended testicles, no significant scrotal edema. Left vasectomy incision clean dry and intact. Right vasectomy incision with slight separation and surrounding granulation tissue but no surrounding erythema, drainage, or any other concerning signs of infection. Slight fullness in the underlying area.  Circumcised. Phallus. Skin: No rashes, bruises or suspicious lesions. Lymph: No cervical or inguinal adenopathy. Neurologic: Grossly intact, no focal deficits, moving all 4 extremities. Psychiatric: Normal mood and affect.  Laboratory Data: Lab Results  Component Value Date   WBC 6.7 09/09/2015   HCT 43.9 09/09/2015   MCV 84 09/09/2015   PLT 219 09/09/2015    Lab Results  Component Value Date   CREATININE 1.13 09/09/2015    Lab Results  Component Value Date   HGBA1C 5.8 (H) 09/09/2015    Assessment & Plan:    1. S/P vasectomy No evidence of infection today. He has slight separation of his right incision but there is no evidence of infection. He was reassured.  Signs and symptoms of infection were reviewed.  Recommend continued supportive care, ibuprofen and Tylenol as needed, scrotal support.  Return if symptoms worsen or fail to improve.  Vanna Scotland, MD  [facility] [address], [address] [place], [place] [address] [phone]

## 2016-02-23 ENCOUNTER — Encounter: Payer: Self-pay | Admitting: Urology

## 2016-04-13 ENCOUNTER — Encounter: Payer: Self-pay | Admitting: Family Medicine

## 2016-04-13 ENCOUNTER — Ambulatory Visit (INDEPENDENT_AMBULATORY_CARE_PROVIDER_SITE_OTHER): Payer: 59 | Admitting: Family Medicine

## 2016-04-13 VITALS — BP 120/78 | HR 72 | Temp 98.5°F | Resp 16 | Ht 72.0 in | Wt 337.0 lb

## 2016-04-13 DIAGNOSIS — G4733 Obstructive sleep apnea (adult) (pediatric): Secondary | ICD-10-CM

## 2016-04-13 DIAGNOSIS — Z9989 Dependence on other enabling machines and devices: Secondary | ICD-10-CM

## 2016-04-13 DIAGNOSIS — J3089 Other allergic rhinitis: Secondary | ICD-10-CM | POA: Diagnosis not present

## 2016-04-13 DIAGNOSIS — J0110 Acute frontal sinusitis, unspecified: Secondary | ICD-10-CM | POA: Insufficient documentation

## 2016-04-13 DIAGNOSIS — E669 Obesity, unspecified: Secondary | ICD-10-CM | POA: Diagnosis not present

## 2016-04-13 DIAGNOSIS — Z6841 Body Mass Index (BMI) 40.0 and over, adult: Secondary | ICD-10-CM | POA: Diagnosis not present

## 2016-04-13 DIAGNOSIS — IMO0001 Reserved for inherently not codable concepts without codable children: Secondary | ICD-10-CM

## 2016-04-13 MED ORDER — PHENTERMINE HCL 37.5 MG PO CAPS: 37.5000 mg | ORAL_CAPSULE | ORAL | 3 refills | Status: DC

## 2016-04-13 MED ORDER — AMOXICILLIN-POT CLAVULANATE 875-125 MG PO TABS: 1.0000 | ORAL_TABLET | Freq: Two times a day (BID) | ORAL | 0 refills | Status: DC

## 2016-04-13 MED ORDER — MONTELUKAST SODIUM 10 MG PO TABS: 10.0000 mg | ORAL_TABLET | Freq: Every day | ORAL | 5 refills | Status: DC

## 2016-04-13 NOTE — Progress Notes (Addendum)
Patient: Brett West Male    DOB: 11/17/1970   45 y.o.   MRN: [id number] Visit Date: 04/13/2016  Today's Provider: Mila Merryonald Ambermarie Honeyman, MD   Chief Complaint  Patient presents with  . Sleep Apnea   Subjective:    HPI Patient comes in today wanting to discuss options or recommendations he could do to stop using his CPAP machine. Patient reports that he does feel much better while using the CPAP machine. However, patient reports that he does not want to use it forever. Patient has been on the CPAP since 08/2015.  He is using CPAP every night and tolerating well, but finds it a hassle and would to not have to use it.       Wt Readings from Last 3 Encounters:  04/13/16 (!) 337 lb (152.9 kg)  02/22/16 (!) 325 lb (147.4 kg)  02/14/16 (!) 328 lb (148.8 kg)   Also complains of 2 weeks of sinus congestion and congestion  In chest. Is on Singulair which he is currently out of. He often has similar symptoms about this time of year requiring antibiotic for sinusitis. No fever, shortness of breath, chest pains. Has mild non-productive cough.   Allergies  Allergen Reactions  . Bee Venom Swelling     Current Outpatient Prescriptions:  .  EPINEPHRINE, ANAPHYLAXIS THERAPY AGENTS,, EPIPEN, 0.3MG /0.3ML (Injection Device)  1 Device Use as directed, as needed for 0 days  Quantity: 2;  Refills: 5   Ordered :03-Apr-2011  Allene Dillonrozdowski, Emily ;  Started 03-Apr-2011 Active Comments: Medication taken as needed. Generic please., Disp: , Rfl:  .  fluticasone (FLONASE) 50 MCG/ACT nasal spray, Place 2 sprays into both nostrils daily., Disp: 16 g, Rfl: 5 .  montelukast (SINGULAIR) 10 MG tablet, Take 1 tablet (10 mg total) by mouth at bedtime., Disp: 30 tablet, Rfl: 5  Review of Systems  Constitutional: Negative for chills, diaphoresis, fatigue and fever.  HENT: Positive for congestion, rhinorrhea and sinus pressure. Negative for ear pain, facial swelling and sore throat.   Respiratory: Positive for cough.  Negative for choking, shortness of breath and stridor.   Cardiovascular: Negative.  Negative for chest pain.    Social History  Substance Use Topics  . Smoking status: Former Smoker    Quit date: 04/30/2004  . Smokeless tobacco: Never Used  . Alcohol use No   Objective:   BP 120/78 (BP Location: Right Arm, Patient Position: Sitting, Cuff Size: Large)   Pulse 72   Temp 98.5 F (36.9 C)   Resp 16   Ht 6' (1.829 m)   Wt (!) 337 lb (152.9 kg)   SpO2 97%   BMI 45.71 kg/m   Physical Exam  General Appearance:    Alert, cooperative, no distress. Morbidly obese  HENT:   bilateral TM normal without fluid or infection, neck without nodes, frontal sinus tender and nasal mucosa pale and congested  Eyes:    PERRL, conjunctiva/corneas clear, EOM's intact       Lungs:     Clear to auscultation bilaterally, respirations unlabored  Heart:    Regular rate and rhythm  Neurologic:   Awake, alert, oriented x 3. No apparent focal neurological           defect.           Assessment & Plan:     1. OSA on CPAP Doing well on CPAP. Counseled that substantial weight loss is often curative. He is not interested in surgical options.  2. Class 3 obesity with serious comorbidity and body mass index (BMI) of 45.0 to 49.9 in adult, unspecified obesity type (HCC) Extensive discussion regarding various weight loss options including bariatric surgery, dietary changes and medications. Wrote rx for saxenda over the summer, but was too expensive. He is interested in trying generic medication. Counseled on risk and benefits of stimulant medications and he would like to try one.  - phentermine 37.5 MG capsule; Take 1 capsule (37.5 mg total) by mouth every morning.  Dispense: 30 capsule; Refill: 3  3. Other allergic rhinitis Doing well with seasonal use of montelukast   - montelukast (SINGULAIR) 10 MG tablet; Take 1 tablet (10 mg total) by mouth at bedtime.  Dispense: 30 tablet; Refill: 5  4. Acute frontal  sinusitis, recurrence not specified  - amoxicillin-clavulanate (AUGMENTIN) 875-125 MG tablet; Take 1 tablet by mouth 2 (two) times daily.  Dispense: 20 tablet; Refill: 0  Follow up 3 months.         Mila Merryonald Adelena Desantiago, MD  [facility]

## 2016-04-27 ENCOUNTER — Encounter: Payer: Self-pay | Admitting: Podiatry

## 2016-04-27 ENCOUNTER — Ambulatory Visit (INDEPENDENT_AMBULATORY_CARE_PROVIDER_SITE_OTHER): Payer: 59 | Admitting: Podiatry

## 2016-04-27 DIAGNOSIS — L600 Ingrowing nail: Secondary | ICD-10-CM

## 2016-04-27 NOTE — Progress Notes (Signed)
Subjective:     Patient ID: Brett West, male   DOB: 10/25/1970, 45 y.o.   MRN: [id number]  HPI patient points to the big toenails left foot stating that it's been ingrown and he cannot take care of it himself   Review of Systems     Objective:   Physical Exam Neurovascular status intact with incurvated left hallux medial border that's painful when pressed    Assessment:     Ingrown toenail deformity left hallux medial border    Plan:     Reviewed correction and explained procedure to patient. Patient wants this done and today I infiltrated 60 Milligan times like Marcaine mixture removed the medial border exposed matrix and applied phenol 3 applications 30 seconds followed by alcohol lavage and sterile dressing. Gave instructions on soaks and reappoint

## 2016-04-27 NOTE — Patient Instructions (Signed)

## 2016-05-24 ENCOUNTER — Ambulatory Visit (INDEPENDENT_AMBULATORY_CARE_PROVIDER_SITE_OTHER): Payer: 59 | Admitting: Podiatry

## 2016-05-24 ENCOUNTER — Encounter: Payer: Self-pay | Admitting: Podiatry

## 2016-05-24 DIAGNOSIS — L03032 Cellulitis of left toe: Secondary | ICD-10-CM

## 2016-05-24 NOTE — Progress Notes (Signed)
Subjective:     Patient ID: Brett West, male   DOB: 06/17/1970, 46 y.o.   MRN: [id number]  HPI patient presents with irritation of the right hallux nail lateral border with concerns and redness   Review of Systems     Objective:   Physical Exam Neurovascular status intact with crusted tissue right hallux lateral border localized in nature with no proximal edema erythema or drainage noted    Assessment:     Localized paronychia right hallux nail with possible plug tissue formation    Plan:     Advised this patient on soaks and we will consider antibiotics if redness were to get worse or drainage or throbbing were to commence

## 2016-10-16 ENCOUNTER — Telehealth: Payer: Self-pay | Admitting: Family Medicine

## 2016-10-16 NOTE — Telephone Encounter (Signed)
In order to complete boyscout form, we need to know if he has had any seizures within the last year, or if he has had any orthopedic injuries or surgeries within the last six months. Please call with patient to find out. Thanks.

## 2016-10-17 NOTE — Telephone Encounter (Signed)
LMOVM for pt to return call 

## 2016-10-17 NOTE — Telephone Encounter (Signed)
Pt returning call.  EA#[phone]/NWCB#[phone]/MW

## 2016-10-17 NOTE — Telephone Encounter (Signed)
Patient answered "no" to both questions.

## 2016-11-27 ENCOUNTER — Ambulatory Visit (INDEPENDENT_AMBULATORY_CARE_PROVIDER_SITE_OTHER): Payer: 59 | Admitting: Family Medicine

## 2016-11-27 ENCOUNTER — Encounter: Payer: Self-pay | Admitting: Family Medicine

## 2016-11-27 VITALS — BP 110/70 | HR 69 | Temp 98.1°F | Resp 16 | Ht 72.0 in | Wt 334.8 lb

## 2016-11-27 DIAGNOSIS — Z9989 Dependence on other enabling machines and devices: Secondary | ICD-10-CM | POA: Diagnosis not present

## 2016-11-27 DIAGNOSIS — Z6841 Body Mass Index (BMI) 40.0 and over, adult: Secondary | ICD-10-CM

## 2016-11-27 DIAGNOSIS — R7303 Prediabetes: Secondary | ICD-10-CM

## 2016-11-27 DIAGNOSIS — Z0000 Encounter for general adult medical examination without abnormal findings: Secondary | ICD-10-CM

## 2016-11-27 DIAGNOSIS — E669 Obesity, unspecified: Secondary | ICD-10-CM

## 2016-11-27 DIAGNOSIS — G4733 Obstructive sleep apnea (adult) (pediatric): Secondary | ICD-10-CM

## 2016-11-27 DIAGNOSIS — IMO0001 Reserved for inherently not codable concepts without codable children: Secondary | ICD-10-CM

## 2016-11-27 DIAGNOSIS — M25561 Pain in right knee: Secondary | ICD-10-CM | POA: Diagnosis not present

## 2016-11-27 MED ORDER — PHENTERMINE HCL 37.5 MG PO CAPS: 37.5000 mg | ORAL_CAPSULE | ORAL | 5 refills | Status: DC

## 2016-11-27 NOTE — Patient Instructions (Signed)
Go to the [facility] on [address] for knee Xray   Preventive Care 40-64 Years, Male Preventive care refers to lifestyle choices and visits with your health care provider that can promote health and wellness. What does preventive care include?  A yearly physical exam. This is also called an annual well check.  Dental exams once or twice a year.  Routine eye exams. Ask your health care provider how often you should have your eyes checked.  Personal lifestyle choices, including: ? Daily care of your teeth and gums. ? Regular physical activity. ? Eating a healthy diet. ? Avoiding tobacco and drug use. ? Limiting alcohol use. ? Practicing safe sex. ? Taking low-dose aspirin every day starting at age 82. What happens during an annual well check? The services and screenings done by your health care provider during your annual well check will depend on your age, overall health, lifestyle risk factors, and family history of disease. Counseling Your health care provider may ask you questions about your:  Alcohol use.  Tobacco use.  Drug use.  Emotional well-being.  Home and relationship well-being.  Sexual activity.  Eating habits.  Work and work Statistician.  Screening You may have the following tests or measurements:  Height, weight, and BMI.  Blood pressure.  Lipid and cholesterol levels. These may be checked every 5 years, or more frequently if you are over 33 years old.  Skin check.  Lung cancer screening. You may have this screening every year starting at age 49 if you have a 30-pack-year history of smoking and currently smoke or have quit within the past 15 years.  Fecal occult blood test (FOBT) of the stool. You may have this test every year starting at age 47.  Flexible sigmoidoscopy or colonoscopy. You may have a sigmoidoscopy every 5 years or a colonoscopy every 10 years starting at age 19.  Prostate cancer screening.  Recommendations will vary depending on your family history and other risks.  Hepatitis C blood test.  Hepatitis B blood test.  Sexually transmitted disease (STD) testing.  Diabetes screening. This is done by checking your blood sugar (glucose) after you have not eaten for a while (fasting). You may have this done every 1-3 years.  Discuss your test results, treatment options, and if necessary, the need for more tests with your health care provider. Vaccines Your health care provider may recommend certain vaccines, such as:  Influenza vaccine. This is recommended every year.  Tetanus, diphtheria, and acellular pertussis (Tdap, Td) vaccine. You may need a Td booster every 10 years.  Varicella vaccine. You may need this if you have not been vaccinated.  Zoster vaccine. You may need this after age 59.  Measles, mumps, and rubella (MMR) vaccine. You may need at least one dose of MMR if you were born in 1957 or later. You may also need a second dose.  Pneumococcal 13-valent conjugate (PCV13) vaccine. You may need this if you have certain conditions and have not been vaccinated.  Pneumococcal polysaccharide (PPSV23) vaccine. You may need one or two doses if you smoke cigarettes or if you have certain conditions.  Meningococcal vaccine. You may need this if you have certain conditions.  Hepatitis A vaccine. You may need this if you have certain conditions or if you travel or work in places where you may be exposed to hepatitis A.  Hepatitis B vaccine. You may need this if you have certain conditions or if you travel or work in  places where you may be exposed to hepatitis B.  Haemophilus influenzae type b (Hib) vaccine. You may need this if you have certain risk factors.  Talk to your health care provider about which screenings and vaccines you need and how often you need them. This information is not intended to replace advice given to you by your health care provider. Make sure you  discuss any questions you have with your health care provider. Document Released: 05/14/2015 Document Revised: 01/05/2016 Document Reviewed: 02/16/2015 Elsevier Interactive Patient Education  2017 [employer].

## 2016-11-27 NOTE — Progress Notes (Signed)
Patient: Brett West, Male    DOB: 01/17/1971, 46 y.o.   MRN: [id number] Visit Date: 11/27/2016  Today's Provider: Mila Merryonald Fisher, MD   Chief Complaint  Patient presents with  . Annual Exam  . Obesity   Subjective:    Annual physical exam Brett West is a 46 y.o. male who presents today for health maintenance and complete physical. He feels well. He reports exercising yes. He reports he is sleeping fairly well.  He is working full time for [employer] and enjoys job.  ----------------------------------------------------------------  Prediabetes From 11/23/2015 Lab Results  Component Value Date   HGBA1C 5.8 (H) 09/09/2015    OSA on CPAP From 04/13/2016-no changes.  Class 3 obesity with serious comorbidity and body mass index (BMI) of 45.0 to 49.9 in adult, unspecified obesity type (HCC) From 04/13/2016- prescribed phentermine 37.5 MG capsule. He wasn't really committed to diet and exercise at the time and wants to try phentermine again.  Wt Readings from Last 3 Encounters:  11/27/16 (!) 334 lb 12.8 oz (151.9 kg)  04/13/16 (!) 337 lb (152.9 kg)  02/22/16 (!) 325 lb (147.4 kg)     Other allergic rhinitis From 04/13/2016-no changes.  He has OSA and uses CPAP every night, feels like it is helping. Has no trouble with sleepiness or energy level.  Patient has had right knee and leg pain for 1 month. Pain radiates from knee to toes and from knee to hip. Patient has taking ibuprofen with moderate relief.   Review of Systems  Constitutional: Negative for chills and fatigue.  Respiratory: Positive for apnea. Negative for shortness of breath.   Cardiovascular: Negative for chest pain and leg swelling.  Gastrointestinal: Negative for abdominal pain, blood in stool, constipation and diarrhea.  Endocrine: Negative for cold intolerance and heat intolerance.  Genitourinary: Negative for frequency.  Musculoskeletal: Positive for arthralgias.  Skin: Positive for  rash.  All other systems reviewed and are negative.   Social History      He  reports that he quit smoking about 12 years ago. He has never used smokeless tobacco. He reports that he does not drink alcohol or use drugs.       Social History   Social History  . Marital status: Married    Spouse name: N/A  . Number of children: 2  . Years of education: N/A   Social History Main Topics  . Smoking status: Former Smoker    Quit date: 04/30/2004  . Smokeless tobacco: Never Used  . Alcohol use No  . Drug use: No  . Sexual activity: Not Asked   Other Topics Concern  . None   Social History Narrative   Works for [employer] [place]    Past Medical History:  Diagnosis Date  . Sleep apnea      Patient Active Problem List   Diagnosis Date Noted  . Acute frontal sinusitis 04/13/2016  . Vasectomy status 12/14/2015  . Prediabetes 11/23/2015  . Low testosterone 09/13/2015  . Allergic rhinitis 03/24/2015  . Allergy to yellow jackets 03/24/2015  . Allergic reaction to bee sting 03/24/2015  . Obesity 03/24/2015  . Arthritis, degenerative 03/24/2015  . Recurrent sinus infections 03/24/2015  . OSA on CPAP 03/24/2015    Past Surgical History:  Procedure Laterality Date  . PLANTAR FASCIECTOMY Right 04/2015  . S/P foot surgery  12/2002  . Sebasceous cyst  03/27/2007   Dr. Michela PitcherEly  . VASECTOMY N/A 02/14/2016  Procedure: VASECTOMY;  Surgeon: Vanna Scotland, MD;  Location: [facility] ORS;  Service: Urology;  Laterality: N/A;    Family History        Family Status  Relation Status  . Mother Alive  . Father Alive  . Brother Alive  . Neg Hx (Not Specified)        His family history includes Arthritis in his father.     Allergies  Allergen Reactions  . Bee Venom Swelling     Current Outpatient Prescriptions:  .  EPINEPHRINE, ANAPHYLAXIS THERAPY AGENTS,, EPIPEN, 0.3MG /0.3ML (Injection Device)  1 Device Use as directed, as needed for 0 days  Quantity: 2;  Refills: 5   Ordered  :03-Apr-2011  Allene Dillon ;  Started 03-Apr-2011 Active Comments: Medication taken as needed. Generic please., Disp: , Rfl:  .  fluticasone (FLONASE) 50 MCG/ACT nasal spray, Place 2 sprays into both nostrils daily., Disp: 16 g, Rfl: 5 .  montelukast (SINGULAIR) 10 MG tablet, Take 1 tablet (10 mg total) by mouth at bedtime., Disp: 30 tablet, Rfl: 5 .  phentermine 37.5 MG capsule, Take 1 capsule (37.5 mg total) by mouth every morning., Disp: 30 capsule, Rfl: 3   Patient Care Team: Malva Limes, MD as PCP - General (Family Medicine)      Objective:   Vitals: BP 110/70 (BP Location: Right Arm, Patient Position: Sitting, Cuff Size: Large)   Pulse 69   Temp 98.1 F (36.7 C) (Oral)   Resp 16   Ht 6' (1.829 m)   Wt (!) 334 lb 12.8 oz (151.9 kg)   SpO2 96%   BMI 45.41 kg/m    Vitals:   11/27/16 0917  BP: 110/70  Pulse: 69  Resp: 16  Temp: 98.1 F (36.7 C)  TempSrc: Oral  SpO2: 96%  Weight: (!) 334 lb 12.8 oz (151.9 kg)  Height: 6' (1.829 m)     Physical Exam   General Appearance:    Alert, cooperative, no distress, appears stated age, [facility].   Head:    Normocephalic, without obvious abnormality, atraumatic  Eyes:    PERRL, conjunctiva/corneas clear, EOM's intact, fundi    benign, both eyes       Ears:    Normal TM's and external ear canals, both ears  Nose:   Nares normal, septum midline, mucosa normal, no drainage   or sinus tenderness  Throat:   Lips, mucosa, and tongue normal; teeth and gums normal  Neck:   Supple, symmetrical, trachea midline, no adenopathy;       thyroid:  No enlargement/tenderness/nodules; no carotid   bruit or JVD  Back:     Symmetric, no curvature, ROM normal, no CVA tenderness  Lungs:     Clear to auscultation bilaterally, respirations unlabored  Chest wall:    No tenderness or deformity  Heart:    Regular rate and rhythm, S1 and S2 normal, no murmur, rub   or gallop  Abdomen:     Soft, non-tender, bowel sounds active all four  quadrants,    no masses, no organomegaly  Genitalia:    deferred  Rectal:    deferred  Extremities:   Extremities normal, atraumatic, no cyanosis or edema, tender right medial joint line with positive Mcmurray's sign  Pulses:   2+ and symmetric all extremities  Skin:   Skin color, texture, turgor normal, no rashes or lesions  Lymph nodes:   Cervical, supraclavicular, and axillary nodes normal  Neurologic:   CNII-XII intact. Normal strength, sensation and  reflexes      throughout    Depression Screen PHQ 2/9 Scores 11/27/2016 11/23/2015  PHQ - 2 Score 0 0  PHQ- 9 Score 2 4      Assessment & Plan:     Routine Health Maintenance and Physical Exam  Exercise Activities and Dietary recommendations Goals    None      Immunization History  Administered Date(s) Administered  . Influenza,inj,Quad PF,36+ Mos 03/02/2015  . Tdap 11/23/2015    Health Maintenance  Topic Date Due  . HIV Screening  12/04/1985  . INFLUENZA VACCINE  11/29/2016  . TETANUS/TDAP  11/22/2025     Discussed health benefits of physical activity, and encouraged him to engage in regular exercise appropriate for his age and condition.    --------------------------------------------------------------------  1. Annual physical exam  - Lipid panel - Hemoglobin A1c - Comprehensive metabolic panel  2. OSA on CPAP Compliant with CPAP with significant benefit for health and QOL  3. Prediabetes  - Hemoglobin A1c  4. Morbid obesity (HCC)   5. Acute pain of right knee Suspect medial meniscus injury.  - DG Knee Complete 4 Views Right; Future  6. Class 3 obesity with serious comorbidity and body mass index (BMI) of 45.0 to 49.9 in adult, unspecified obesity type (HCC)  - phentermine 37.5 MG capsule; Take 1 capsule (37.5 mg total) by mouth every morning.  Dispense: 30 capsule; Refill: 5   Mila Merryonald Fisher, MD  [facility]

## 2016-11-28 ENCOUNTER — Telehealth: Payer: Self-pay

## 2016-11-28 ENCOUNTER — Encounter: Payer: Self-pay | Admitting: Family Medicine

## 2016-11-28 LAB — COMPREHENSIVE METABOLIC PANEL
A/G RATIO: 1.6 (ref 1.2–2.2)
ALT: 38 IU/L (ref 0–44)
AST: 31 IU/L (ref 0–40)
Albumin: 4.2 g/dL (ref 3.5–5.5)
Alkaline Phosphatase: 76 IU/L (ref 39–117)
BUN/Creatinine Ratio: 12 (ref 9–20)
BUN: 15 mg/dL (ref 6–24)
Bilirubin Total: 0.4 mg/dL (ref 0.0–1.2)
CALCIUM: 8.9 mg/dL (ref 8.7–10.2)
CO2: 23 mmol/L (ref 20–29)
Chloride: 103 mmol/L (ref 96–106)
Creatinine, Ser: 1.27 mg/dL (ref 0.76–1.27)
GFR, EST AFRICAN AMERICAN: 78 mL/min/{1.73_m2} (ref >59)
GFR, EST NON AFRICAN AMERICAN: 68 mL/min/{1.73_m2} (ref >59)
GLOBULIN, TOTAL: 2.6 g/dL (ref 1.5–4.5)
GLUCOSE: 103 mg/dL — AB (ref 65–99)
POTASSIUM: 5 mmol/L (ref 3.5–5.2)
SODIUM: 140 mmol/L (ref 134–144)
TOTAL PROTEIN: 6.8 g/dL (ref 6.0–8.5)

## 2016-11-28 LAB — LIPID PANEL
CHOLESTEROL TOTAL: 182 mg/dL (ref 100–199)
Chol/HDL Ratio: 5.2 ratio — ABNORMAL HIGH (ref 0.0–5.0)
HDL: 35 mg/dL — ABNORMAL LOW (ref >39)
LDL CALC: 105 mg/dL — AB (ref 0–99)
Triglycerides: 211 mg/dL — ABNORMAL HIGH (ref 0–149)
VLDL Cholesterol Cal: 42 mg/dL — ABNORMAL HIGH (ref 5–40)

## 2016-11-28 LAB — HEMOGLOBIN A1C
ESTIMATED AVERAGE GLUCOSE: 117 mg/dL
Hgb A1c MFr Bld: 5.7 % — ABNORMAL HIGH (ref 4.8–5.6)

## 2016-11-28 NOTE — Telephone Encounter (Signed)
Pt advised. He agrees with treatment plan.

## 2016-11-28 NOTE — Telephone Encounter (Signed)
-----   Message from Malva Limesonald E Fisher, MD sent at 11/28/2016  1:15 PM EDT ----- Triglycerides are a little high at 211, average blood sugar is 117 which is 'pre-diabetic'  Kidney and liver functions normal. Need to strictly avoid sweets in diet and avoid white starch foods. Should also improve with weight loss. Not high enough to require medications. Need to check labs yearly.

## 2016-12-13 ENCOUNTER — Telehealth: Payer: Self-pay | Admitting: Family Medicine

## 2016-12-13 NOTE — Telephone Encounter (Signed)
Error/MW °

## 2016-12-19 ENCOUNTER — Ambulatory Visit
Admission: RE | Admit: 2016-12-19 | Discharge: 2016-12-19 | Disposition: A | Discharge status: Home / Self Care | Payer: 59 | Source: Ambulatory Visit | Attending: Family Medicine | Admitting: Family Medicine

## 2016-12-19 DIAGNOSIS — M25561 Pain in right knee: Secondary | ICD-10-CM

## 2016-12-19 DIAGNOSIS — M1711 Unilateral primary osteoarthritis, right knee: Secondary | ICD-10-CM | POA: Insufficient documentation

## 2016-12-22 ENCOUNTER — Telehealth: Payer: Self-pay | Admitting: *Deleted

## 2016-12-22 DIAGNOSIS — M25561 Pain in right knee: Secondary | ICD-10-CM

## 2016-12-22 NOTE — Telephone Encounter (Signed)
-----   Message from Malva Limes, MD sent at 12/20/2016  8:05 AM EDT ----- He's got some arthritis which may be compressing meniscus causing knee pain. Should wear elastic knee brace when walking, apply ice when sore or swollen. Losing weight would help. May be helpful to see orthopedist for cortisone injection. Ok to refer if he wants.

## 2016-12-22 NOTE — Telephone Encounter (Signed)
Please schedule referral to orthopedics? Thanks! 

## 2016-12-26 ENCOUNTER — Telehealth: Payer: Self-pay | Admitting: Family Medicine

## 2016-12-26 NOTE — Telephone Encounter (Signed)
Please advise 

## 2016-12-26 NOTE — Telephone Encounter (Signed)
Pt states that his knee is so painful that it is keeping him awake at night and very hard for him to walk. Any recommendations until seen by ortho ? Pt's appointment with ortho is 12/28/16

## 2016-12-26 NOTE — Telephone Encounter (Signed)
Just frequent icing, can take up to 3 OTC ibuprofen every 4 hours.  Call if any fever or redness of knee.

## 2016-12-26 NOTE — Telephone Encounter (Signed)
Tried calling patient, no answer. Will try again later.  

## 2016-12-27 NOTE — Telephone Encounter (Signed)
LMTCB 12/27/2016  Thanks,   -Teresha Hanks  

## 2016-12-29 NOTE — Telephone Encounter (Signed)
Pt advised. Patient did see ortho yesterday already-aa

## 2017-01-05 ENCOUNTER — Telehealth: Payer: Self-pay | Admitting: Family Medicine

## 2017-01-05 ENCOUNTER — Ambulatory Visit (INDEPENDENT_AMBULATORY_CARE_PROVIDER_SITE_OTHER): Payer: 59 | Admitting: Family Medicine

## 2017-01-05 ENCOUNTER — Encounter: Payer: Self-pay | Admitting: Family Medicine

## 2017-01-05 DIAGNOSIS — Z6841 Body Mass Index (BMI) 40.0 and over, adult: Secondary | ICD-10-CM | POA: Diagnosis not present

## 2017-01-05 DIAGNOSIS — E669 Obesity, unspecified: Secondary | ICD-10-CM | POA: Diagnosis not present

## 2017-01-05 DIAGNOSIS — J0110 Acute frontal sinusitis, unspecified: Secondary | ICD-10-CM

## 2017-01-05 DIAGNOSIS — IMO0001 Reserved for inherently not codable concepts without codable children: Secondary | ICD-10-CM

## 2017-01-05 DIAGNOSIS — J3089 Other allergic rhinitis: Secondary | ICD-10-CM | POA: Diagnosis not present

## 2017-01-05 MED ORDER — PHENTERMINE HCL 37.5 MG PO CAPS: 37.5000 mg | ORAL_CAPSULE | ORAL | 5 refills | Status: DC

## 2017-01-05 MED ORDER — FLUTICASONE PROPIONATE 50 MCG/ACT NA SUSP: 2.0000 | Freq: Every day | NASAL | 5 refills | Status: DC

## 2017-01-05 MED ORDER — AMOXICILLIN-POT CLAVULANATE 875-125 MG PO TABS: 1.0000 | ORAL_TABLET | Freq: Two times a day (BID) | ORAL | 0 refills | Status: DC

## 2017-01-05 NOTE — Telephone Encounter (Signed)
Pt thinks he has a sinus infection and is requesting an [facility] [address] I advised we don't treat over the phone and offered an appt. Pt scheduled OV for today at 3:30 but requested a message be sent asking for the medication so he could cancel the appt. Please advise. Thanks TNP

## 2017-01-05 NOTE — Progress Notes (Signed)
Patient: Brett West Male    DOB: 12/29/1970   46 y.o.   MRN: [id number] Visit Date: 01/05/2017  Today's Provider: Mila Merryonald Takashi Korol, MD   Chief Complaint  Patient presents with  . Sinusitis   Subjective:    Sinusitis  This is a new problem. The current episode started in the past 7 days. The problem has been gradually worsening since onset. There has been no fever. He is experiencing no pain. Associated symptoms include congestion, sinus pressure and sneezing. Pertinent negatives include no ear pain. Past treatments include nothing.   Has been of of Flonase for th since July. Green no pain. No fevers.    Wt Readings from Last 7 Encounters:  01/05/17 (!) 340 lb (154.2 kg)  11/27/16 (!) 334 lb 12.8 oz (151.9 kg)  04/13/16 (!) 337 lb (152.9 kg)  02/22/16 (!) 325 lb (147.4 kg)  02/14/16 (!) 328 lb (148.8 kg)  02/04/16 (!) 328 lb (148.8 kg)  12/14/15 (!) (P) 327 lb 4.8 oz (148.5 kg)       Allergies  Allergen Reactions  . Bee Venom Swelling     Current Outpatient Prescriptions:  .  EPINEPHRINE, ANAPHYLAXIS THERAPY AGENTS,, EPIPEN, 0.3MG /0.3ML (Injection Device)  1 Device Use as directed, as needed for 0 days  Quantity: 2;  Refills: 5   Ordered :03-Apr-2011  Allene Dillonrozdowski, Emily ;  Started 03-Apr-2011 Active Comments: Medication taken as needed. Generic please., Disp: , Rfl:  .  fluticasone (FLONASE) 50 MCG/ACT nasal spray, Place 2 sprays into both nostrils daily., Disp: 16 g, Rfl: 5 .  montelukast (SINGULAIR) 10 MG tablet, Take 1 tablet (10 mg total) by mouth at bedtime., Disp: 30 tablet, Rfl: 5 .  phentermine 37.5 MG capsule, Take 1 capsule (37.5 mg total) by mouth every morning., Disp: 30 capsule, Rfl: 5  Review of Systems  HENT: Positive for congestion, sinus pressure and sneezing. Negative for ear pain.     Social History  Substance Use Topics  . Smoking status: Former Smoker    Quit date: 04/30/2004  . Smokeless tobacco: Never Used  . Alcohol use No    Objective:   BP 126/74 (BP Location: Right Arm, Patient Position: Sitting, Cuff Size: Large)   Pulse 96   Temp 98.1 F (36.7 C)   Resp 16   Ht 6\' 2"  (1.88 m)   Wt (!) 340 lb (154.2 kg)   SpO2 96%   BMI 43.65 kg/m     Physical Exam  General Appearance:    Alert, cooperative, no distress  HENT:   bilateral TM normal without fluid or infection, neck without nodes and frontal and maxillary sinuses tender  Eyes:    PERRL, conjunctiva/corneas clear, EOM's intact       Lungs:     Clear to auscultation bilaterally, respirations unlabored  Heart:    Regular rate and rhythm  Neurologic:   Awake, alert, oriented x 3. No apparent focal neurological           defect.          Assessment & Plan:     1. Acute frontal sinusitis, recurrence not specified He thinks that Augmentin worked the best in the past and was given new prescription for this today.   2. Other allergic rhinitis  - fluticasone (FLONASE) 50 MCG/ACT nasal spray; Place 2 sprays into both nostrils daily.  Dispense: 16 g; Refill: 5         Mila Merryonald Zubayr Bednarczyk, MD  [facility]

## 2017-01-10 ENCOUNTER — Telehealth: Payer: Self-pay | Admitting: Family Medicine

## 2017-01-10 DIAGNOSIS — IMO0001 Reserved for inherently not codable concepts without codable children: Secondary | ICD-10-CM

## 2017-01-10 MED ORDER — AZITHROMYCIN 250 MG PO TABS: ORAL_TABLET | ORAL | 0 refills | Status: DC

## 2017-01-10 MED ORDER — PHENTERMINE HCL 37.5 MG PO CAPS: 37.5000 mg | ORAL_CAPSULE | ORAL | 5 refills | Status: DC

## 2017-01-10 MED ORDER — AZITHROMYCIN 250 MG PO TABS: ORAL_TABLET | ORAL | 0 refills | Status: AC

## 2017-01-10 NOTE — Telephone Encounter (Signed)
Pt called to say that he is 1/2 way through his antibiotic for a sinus infection and does not see improvement   Please advise [phone]  Thanks teri

## 2017-01-10 NOTE — Telephone Encounter (Signed)
Spoke with patient on phone to address symptoms he still is having and patient reports green nasal drainage.KW

## 2017-01-10 NOTE — Telephone Encounter (Signed)
Patient was notified. Patient [facility]. Resent rx's for phentermine and z-pack and canceled [facility]

## 2017-01-10 NOTE — Telephone Encounter (Signed)
Can change to zpack. [facility] [address]

## 2017-01-12 ENCOUNTER — Other Ambulatory Visit: Payer: Self-pay | Admitting: Family Medicine

## 2017-01-12 DIAGNOSIS — J3089 Other allergic rhinitis: Secondary | ICD-10-CM

## 2017-01-13 MED ORDER — MONTELUKAST SODIUM 10 MG PO TABS: 10.0000 mg | ORAL_TABLET | Freq: Every day | ORAL | 5 refills | Status: DC

## 2017-02-05 ENCOUNTER — Telehealth: Payer: Self-pay | Admitting: Family Medicine

## 2017-02-05 NOTE — Telephone Encounter (Signed)
Appointment scheduled with Dr Dorthula [facility]) 02/08/17 at 9:30.Pt advised

## 2017-02-05 NOTE — Telephone Encounter (Signed)
Pt wants to have a referral to [facility] ortho instead of [facility].  States a referral for [facility] was done however he would like to go elsewhere.

## 2017-03-19 ENCOUNTER — Other Ambulatory Visit: Payer: Self-pay | Admitting: Orthopedic Surgery

## 2017-03-29 ENCOUNTER — Inpatient Hospital Stay
Admission: RE | Admit: 2017-03-29 | Discharge: 2017-03-29 | Disposition: A | Discharge status: Home / Self Care | Payer: 59 | Source: Ambulatory Visit

## 2017-03-30 ENCOUNTER — Inpatient Hospital Stay: Admission: RE | Admit: 2017-03-30 | Payer: 59 | Source: Ambulatory Visit

## 2017-04-02 ENCOUNTER — Encounter
Admission: RE | Admit: 2017-04-02 | Discharge: 2017-04-02 | Disposition: A | Discharge status: Home / Self Care | Payer: 59 | Source: Ambulatory Visit | Attending: Orthopedic Surgery | Admitting: Orthopedic Surgery

## 2017-04-02 ENCOUNTER — Other Ambulatory Visit: Payer: Self-pay

## 2017-04-02 HISTORY — DX: Personal history of Methicillin resistant Staphylococcus aureus infection: Z86.14

## 2017-04-02 NOTE — Patient Instructions (Signed)
  Your procedure is scheduled on: 04-05-17 THURSDAY Report to Same Day Surgery 2nd floor medical mall [facility]Medical Mall Entrance-take elevator on left to 2nd floor.  Check in with surgery information desk.) To find out your arrival time please call [phone] between 1PM - 3PM [facility]  Remember: Instructions that are not followed completely may result in serious medical risk, up to and including death, or upon the discretion of your surgeon and anesthesiologist your surgery may need to be rescheduled.    _x___ 1. Do not eat food after midnight the night before your procedure. You may drink clear liquids up to 2 hours before you are scheduled to arrive at the hospital for your procedure.  Do not drink clear liquids within 2 hours of your scheduled arrival to the hospital.  Clear liquids include  --Water or Apple juice without pulp  --Clear carbohydrate beverage such as ClearFast or Gatorade  --Black Coffee or Clear Tea (No milk, no creamers, do not add anything to the coffee or Tea    __x__ 2. No Alcohol for 24 hours before or after surgery.   __x__3. No Smoking for 24 prior to surgery.   ____  4. Bring all medications with you on the day of surgery if instructed.    __x__ 5. Notify your doctor if there is any change in your medical condition     (cold, fever, infections).     Do not wear jewelry, make-up, hairpins, clips or nail polish.  Do not wear lotions, powders, or perfumes. You may wear deodorant.  Do not shave 48 hours prior to surgery. Men may shave face and neck.  Do not bring valuables to the hospital.    [facility] is not responsible for any belongings or valuables.               Contacts, dentures or bridgework may not be worn into surgery.  Leave your suitcase in the car. After surgery it may be brought to your room.  For patients admitted to the hospital, discharge time is determined by your treatment team.   Patients discharged the day of surgery will not  be allowed to drive home.  You will need someone to drive you home and stay with you the night of your procedure.    Please read over the following fact sheets that you were given:   [facility] Pre[facility] and or MRSA Information   ____ Take anti-hypertensive listed below, cardiac, seizure, asthma, anti-reflux and psychiatric medicines. These include:  1. NONE  2.  3.  4.  5.  6.  ____Fleets enema or Magnesium Citrate as directed.   _x___ Use CHG Soap or sage wipes as directed on instruction sheet   ____ Use inhalers on the day of surgery and bring to hospital day of surgery  ____ Stop Metformin and Janumet 2 days prior to surgery.    ____ Take 1/2 of usual insulin dose the night before surgery and none on the morning surgery.   ____ Follow recommendations from Cardiologist, Pulmonologist or PCP regarding stopping Aspirin, Coumadin, Plavix ,Eliquis, Effient, or Pradaxa, and Pletal.  X____Stop Anti-inflammatories such as Advil, Aleve, IBUPROFEN, Motrin, Naproxen,MOBIC, Naprosyn, Goodies powders or aspirin products NOW- OK to take Tylenol    ____ Stop supplements until after surgery.     _X___ Bring C-Pap to the hospital.

## 2017-04-03 ENCOUNTER — Inpatient Hospital Stay
Admission: RE | Admit: 2017-04-03 | Discharge: 2017-04-03 | Disposition: A | Discharge status: Home / Self Care | Payer: 59 | Source: Ambulatory Visit

## 2017-04-05 ENCOUNTER — Ambulatory Visit: Payer: 59 | Admitting: Anesthesiology

## 2017-04-05 ENCOUNTER — Ambulatory Visit
Admission: RE | Admit: 2017-04-05 | Discharge: 2017-04-05 | Disposition: A | Discharge status: Home / Self Care | Payer: 59 | Source: Ambulatory Visit | Attending: Orthopedic Surgery | Admitting: Orthopedic Surgery

## 2017-04-05 ENCOUNTER — Encounter: Payer: Self-pay | Admitting: *Deleted

## 2017-04-05 ENCOUNTER — Encounter
Admission: RE | Disposition: A | Discharge status: Home / Self Care | Payer: Self-pay | Source: Ambulatory Visit | Attending: Orthopedic Surgery

## 2017-04-05 DIAGNOSIS — S83241A Other tear of medial meniscus, current injury, right knee, initial encounter: Secondary | ICD-10-CM | POA: Insufficient documentation

## 2017-04-05 DIAGNOSIS — Z87891 Personal history of nicotine dependence: Secondary | ICD-10-CM | POA: Diagnosis not present

## 2017-04-05 DIAGNOSIS — G4730 Sleep apnea, unspecified: Secondary | ICD-10-CM | POA: Insufficient documentation

## 2017-04-05 DIAGNOSIS — J449 Chronic obstructive pulmonary disease, unspecified: Secondary | ICD-10-CM | POA: Diagnosis not present

## 2017-04-05 HISTORY — PX: KNEE ARTHROSCOPY: SHX127

## 2017-04-05 LAB — CBC WITH DIFFERENTIAL/PLATELET
BASOS ABS: 0.1 10*3/uL (ref 0–0.1)
BASOS PCT: 1 %
EOS ABS: 0.1 10*3/uL (ref 0–0.7)
Eosinophils Relative: 1 %
HEMATOCRIT: 44.8 % (ref 40.0–52.0)
HEMOGLOBIN: 15.3 g/dL (ref 13.0–18.0)
Lymphocytes Relative: 44 %
Lymphs Abs: 3.2 10*3/uL (ref 1.0–3.6)
MCH: 28.1 pg (ref 26.0–34.0)
MCHC: 34.2 g/dL (ref 32.0–36.0)
MCV: 82.3 fL (ref 80.0–100.0)
Monocytes Absolute: 0.5 10*3/uL (ref 0.2–1.0)
Monocytes Relative: 6 %
NEUTROS ABS: 3.5 10*3/uL (ref 1.4–6.5)
Neutrophils Relative %: 48 %
Platelets: 200 10*3/uL (ref 150–440)
RBC: 5.44 MIL/uL (ref 4.40–5.90)
RDW: 14.4 % (ref 11.5–14.5)
WBC: 7.4 10*3/uL (ref 3.8–10.6)

## 2017-04-05 LAB — PROTIME-INR
INR: 1
PROTHROMBIN TIME: 13.1 s (ref 11.4–15.2)

## 2017-04-05 LAB — BASIC METABOLIC PANEL
ANION GAP: 10 (ref 5–15)
BUN: 17 mg/dL (ref 6–20)
CALCIUM: 8.7 mg/dL — AB (ref 8.9–10.3)
CO2: 22 mmol/L (ref 22–32)
CREATININE: 1.22 mg/dL (ref 0.61–1.24)
Chloride: 105 mmol/L (ref 101–111)
GFR calc Af Amer: >60 mL/min (ref >60)
Glucose, Bld: 117 mg/dL — ABNORMAL HIGH (ref 65–99)
Potassium: 4.2 mmol/L (ref 3.5–5.1)
Sodium: 137 mmol/L (ref 135–145)

## 2017-04-05 LAB — APTT: aPTT: 37 seconds — ABNORMAL HIGH (ref 24–36)

## 2017-04-05 LAB — SURGICAL PCR SCREEN
MRSA, PCR: NEGATIVE
STAPHYLOCOCCUS AUREUS: NEGATIVE

## 2017-04-05 SURGERY — ARTHROSCOPY, KNEE
Anesthesia: General | Laterality: Right

## 2017-04-05 MED ORDER — GLYCOPYRROLATE 0.2 MG/ML IJ SOLN
INTRAMUSCULAR | Status: AC
  Filled 2017-04-05: qty 1

## 2017-04-05 MED ORDER — BUPIVACAINE-EPINEPHRINE (PF) 0.25% -1:200000 IJ SOLN
INTRAMUSCULAR | Status: AC
  Filled 2017-04-05: qty 30

## 2017-04-05 MED ORDER — LIDOCAINE HCL (PF) 1 % IJ SOLN
INTRAMUSCULAR | Status: AC
  Filled 2017-04-05: qty 30

## 2017-04-05 MED ORDER — ACETAMINOPHEN 10 MG/ML IV SOLN
INTRAVENOUS | Status: DC | PRN
  Administered 2017-04-05: 1000 mg via INTRAVENOUS

## 2017-04-05 MED ORDER — BUPIVACAINE-EPINEPHRINE 0.25% -1:200000 IJ SOLN
INTRAMUSCULAR | Status: DC | PRN
  Administered 2017-04-05: 20 mL

## 2017-04-05 MED ORDER — HYDROCODONE-ACETAMINOPHEN 5-325 MG PO TABS: 1.0000 | ORAL_TABLET | ORAL | 0 refills | Status: DC | PRN

## 2017-04-05 MED ORDER — HYDROCODONE-ACETAMINOPHEN 5-325 MG PO TABS
1.0000 | ORAL_TABLET | ORAL | Status: DC | PRN
  Administered 2017-04-05: 1 via ORAL

## 2017-04-05 MED ORDER — ASPIRIN EC 325 MG PO TBEC: 325.0000 mg | DELAYED_RELEASE_TABLET | Freq: Every day | ORAL | 0 refills | Status: DC

## 2017-04-05 MED ORDER — ONDANSETRON HCL 4 MG/2ML IJ SOLN: 4.0000 mg | Freq: Once | INTRAMUSCULAR | Status: DC | PRN

## 2017-04-05 MED ORDER — DEXAMETHASONE SODIUM PHOSPHATE 10 MG/ML IJ SOLN
INTRAMUSCULAR | Status: AC
  Filled 2017-04-05: qty 1

## 2017-04-05 MED ORDER — HYDROCODONE-ACETAMINOPHEN 5-325 MG PO TABS
ORAL_TABLET | ORAL | Status: AC
  Filled 2017-04-05: qty 1

## 2017-04-05 MED ORDER — GLYCOPYRROLATE 0.2 MG/ML IJ SOLN
INTRAMUSCULAR | Status: DC | PRN
  Administered 2017-04-05: 0.2 mg via INTRAVENOUS

## 2017-04-05 MED ORDER — PROPOFOL 10 MG/ML IV BOLUS
INTRAVENOUS | Status: DC | PRN
  Administered 2017-04-05: 200 mg via INTRAVENOUS

## 2017-04-05 MED ORDER — ONDANSETRON HCL 4 MG/2ML IJ SOLN
INTRAMUSCULAR | Status: DC | PRN
  Administered 2017-04-05: 4 mg via INTRAVENOUS

## 2017-04-05 MED ORDER — KETOROLAC TROMETHAMINE 30 MG/ML IJ SOLN
INTRAMUSCULAR | Status: AC
  Filled 2017-04-05: qty 1

## 2017-04-05 MED ORDER — CEFAZOLIN SODIUM-DEXTROSE 2-4 GM/100ML-% IV SOLN
2.0000 g | INTRAVENOUS | Status: AC
  Administered 2017-04-05: 3 g via INTRAVENOUS

## 2017-04-05 MED ORDER — LIDOCAINE HCL (PF) 2 % IJ SOLN
INTRAMUSCULAR | Status: DC | PRN
  Administered 2017-04-05: 50 mg

## 2017-04-05 MED ORDER — PROPOFOL 10 MG/ML IV BOLUS
INTRAVENOUS | Status: AC
  Filled 2017-04-05: qty 20

## 2017-04-05 MED ORDER — FENTANYL CITRATE (PF) 100 MCG/2ML IJ SOLN
INTRAMUSCULAR | Status: AC
  Administered 2017-04-05: 25 ug via INTRAVENOUS
  Filled 2017-04-05: qty 2

## 2017-04-05 MED ORDER — LACTATED RINGERS IV SOLN
INTRAVENOUS | Status: DC
  Administered 2017-04-05: 50 mL/h via INTRAVENOUS

## 2017-04-05 MED ORDER — FENTANYL CITRATE (PF) 100 MCG/2ML IJ SOLN
INTRAMUSCULAR | Status: DC | PRN
  Administered 2017-04-05 (×5): 50 ug via INTRAVENOUS

## 2017-04-05 MED ORDER — LIDOCAINE HCL (PF) 2 % IJ SOLN
INTRAMUSCULAR | Status: AC
  Filled 2017-04-05: qty 10

## 2017-04-05 MED ORDER — CHLORHEXIDINE GLUCONATE CLOTH 2 % EX PADS: 6.0000 | MEDICATED_PAD | Freq: Once | CUTANEOUS | Status: DC

## 2017-04-05 MED ORDER — KETOROLAC TROMETHAMINE 30 MG/ML IJ SOLN
INTRAMUSCULAR | Status: DC | PRN
Start: 2017-04-05 — End: 2017-04-05
  Administered 2017-04-05: 30 mg via INTRAVENOUS

## 2017-04-05 MED ORDER — DEXAMETHASONE SODIUM PHOSPHATE 10 MG/ML IJ SOLN
INTRAMUSCULAR | Status: DC | PRN
  Administered 2017-04-05: 5 mg via INTRAVENOUS

## 2017-04-05 MED ORDER — CEFAZOLIN SODIUM 1 G IJ SOLR
INTRAMUSCULAR | Status: AC
  Filled 2017-04-05: qty 10

## 2017-04-05 MED ORDER — ONDANSETRON HCL 4 MG PO TABS: 4.0000 mg | ORAL_TABLET | Freq: Three times a day (TID) | ORAL | 0 refills | Status: DC | PRN

## 2017-04-05 MED ORDER — LIDOCAINE HCL 1 % IJ SOLN
INTRAMUSCULAR | Status: DC | PRN
  Administered 2017-04-05: 10 mL

## 2017-04-05 MED ORDER — ONDANSETRON HCL 4 MG/2ML IJ SOLN
INTRAMUSCULAR | Status: AC
  Filled 2017-04-05: qty 2

## 2017-04-05 MED ORDER — FENTANYL CITRATE (PF) 100 MCG/2ML IJ SOLN
25.0000 ug | INTRAMUSCULAR | Status: DC | PRN
  Administered 2017-04-05 (×4): 25 ug via INTRAVENOUS

## 2017-04-05 MED ORDER — SODIUM CHLORIDE 0.9 % IJ SOLN
INTRAMUSCULAR | Status: AC
  Filled 2017-04-05: qty 10

## 2017-04-05 MED ORDER — ACETAMINOPHEN 10 MG/ML IV SOLN
INTRAVENOUS | Status: AC
  Filled 2017-04-05: qty 100

## 2017-04-05 MED ORDER — FENTANYL CITRATE (PF) 250 MCG/5ML IJ SOLN
INTRAMUSCULAR | Status: AC
  Filled 2017-04-05: qty 5

## 2017-04-05 MED ORDER — FAMOTIDINE 20 MG PO TABS
ORAL_TABLET | ORAL | Status: AC
  Administered 2017-04-05: 20 mg via ORAL
  Filled 2017-04-05: qty 1

## 2017-04-05 MED ORDER — SEVOFLURANE IN SOLN
RESPIRATORY_TRACT | Status: AC
  Filled 2017-04-05: qty 250

## 2017-04-05 MED ORDER — FAMOTIDINE 20 MG PO TABS
20.0000 mg | ORAL_TABLET | Freq: Once | ORAL | Status: AC
  Administered 2017-04-05: 20 mg via ORAL

## 2017-04-05 MED ORDER — MIDAZOLAM HCL 2 MG/2ML IJ SOLN
INTRAMUSCULAR | Status: AC
  Filled 2017-04-05: qty 2

## 2017-04-05 MED ORDER — MIDAZOLAM HCL 5 MG/5ML IJ SOLN
INTRAMUSCULAR | Status: DC | PRN
  Administered 2017-04-05: 2 mg via INTRAVENOUS

## 2017-04-05 SURGICAL SUPPLY — 35 items
BUR RADIUS 3.5 (BURR) ×2 IMPLANT
BUR RADIUS 4.0X18.5 (BURR) ×2 IMPLANT
COOLER POLAR GLACIER W/PUMP (MISCELLANEOUS) ×2 IMPLANT
CUFF TOURN 34 STER (MISCELLANEOUS) ×2 IMPLANT
DEVICE SUCT BLK HOLE OR FLOOR (MISCELLANEOUS) ×2 IMPLANT
DRAPE IMP U-DRAPE 54X76 (DRAPES) ×2 IMPLANT
DURAPREP 26ML APPLICATOR (WOUND CARE) ×4 IMPLANT
GAUZE SPONGE 4X4 12PLY STRL (GAUZE/BANDAGES/DRESSINGS) ×2 IMPLANT
GLOVE BIOGEL PI IND STRL 9 (GLOVE) ×1 IMPLANT
GLOVE BIOGEL PI INDICATOR 9 (GLOVE) ×1
GLOVE SURG 9.0 ORTHO LTXF (GLOVE) ×4 IMPLANT
GOWN STRL REUS TWL 2XL XL LVL4 (GOWN DISPOSABLE) ×2 IMPLANT
GOWN STRL REUS W/ TWL LRG LVL3 (GOWN DISPOSABLE) ×1 IMPLANT
GOWN STRL REUS W/TWL LRG LVL3 (GOWN DISPOSABLE) ×1
IV LACTATED RINGER IRRG 3000ML (IV SOLUTION) ×3
IV LR IRRIG 3000ML ARTHROMATIC (IV SOLUTION) ×3 IMPLANT
KIT RM TURNOVER STRD PROC AR (KITS) ×2 IMPLANT
MANIFOLD NEPTUNE II (INSTRUMENTS) ×2 IMPLANT
MAT BLUE FLOOR 46X72 FLO (MISCELLANEOUS) ×4 IMPLANT
NEEDLE HYPO 22GX1.5 SAFETY (NEEDLE) ×2 IMPLANT
PACK ARTHROSCOPY KNEE (MISCELLANEOUS) ×2 IMPLANT
PAD ABD DERMACEA PRESS 5X9 (GAUZE/BANDAGES/DRESSINGS) ×2 IMPLANT
PAD WRAPON POLAR KNEE (MISCELLANEOUS) ×1 IMPLANT
PAD WRAPON POLOR MULTI XL (MISCELLANEOUS) ×1 IMPLANT
SET TUBE SUCT SHAVER OUTFL 24K (TUBING) ×2 IMPLANT
SET TUBE TIP INTRA-ARTICULAR (MISCELLANEOUS) ×2 IMPLANT
STRIP CLOSURE SKIN 1/2X4 (GAUZE/BANDAGES/DRESSINGS) ×2 IMPLANT
SUT ETHILON 4-0 (SUTURE) ×1
SUT ETHILON 4-0 FS2 18XMFL BLK (SUTURE) ×1
SUTURE ETHLN 4-0 FS2 18XMF BLK (SUTURE) ×1 IMPLANT
TUBING ARTHRO INFLOW-ONLY STRL (TUBING) ×2 IMPLANT
WAND HAND CNTRL MULTIVAC 90 (MISCELLANEOUS) ×2 IMPLANT
WRAP-ON POLOR PAD MULTI XL (MISCELLANEOUS) ×1
WRAPON POLAR PAD KNEE (MISCELLANEOUS) ×2
WRAPON POLOR PAD MULTI XL (MISCELLANEOUS) ×1

## 2017-04-05 NOTE — Transfer of Care (Signed)
Immediate Anesthesia Transfer of Care Note  Patient: Brett West  Procedure(s) Performed: ARTHROSCOPY RIGHT KNEE, PARTIAL MEDIAL MENISECTOMY (Right )  Patient Location: PACU  Anesthesia Type:General  Level of Consciousness: sedated  Airway & Oxygen Therapy: Patient Spontanous Breathing and Patient connected to face mask oxygen  Post-op Assessment: Report given to RN and Post -op Vital signs reviewed and stable  Post vital signs: Reviewed  Last Vitals:  Vitals:   04/05/17 0615 04/05/17 0944  BP: (!) 142/78 116/75  Pulse: 86 69  Resp: 15 14  Temp: 36.7 C   SpO2: 96% 98%    Last Pain:  Vitals:   04/05/17 0615  TempSrc: Oral  PainSc: 0-No pain      Patients Stated Pain Goal: 0 (04/05/17 0615)  Complications: No apparent anesthesia complications

## 2017-04-05 NOTE — Anesthesia Postprocedure Evaluation (Signed)
Anesthesia Post Note  Patient: Brett GustJames T West  Procedure(s) Performed: ARTHROSCOPY RIGHT KNEE, PARTIAL MEDIAL MENISECTOMY (Right )  Patient location during evaluation: PACU Anesthesia Type: General Level of consciousness: awake and alert Pain management: pain level controlled Vital Signs Assessment: post-procedure vital signs reviewed and stable Respiratory status: spontaneous breathing and respiratory function stable Cardiovascular status: stable Anesthetic complications: no     Last Vitals:  Vitals:   04/05/17 0943 04/05/17 0944  BP:  116/75  Pulse:  69  Resp:  14  Temp: 36.4 C   SpO2:  98%    Last Pain:  Vitals:   04/05/17 0952  TempSrc:   PainSc: 0-No pain                 KEPHART,WILLIAM K

## 2017-04-05 NOTE — Anesthesia Preprocedure Evaluation (Signed)
Anesthesia Evaluation  Patient identified by MRN, date of birth, ID band Patient awake    Reviewed: Allergy & Precautions, NPO status , Patient's Chart, lab work & pertinent test results  History of Anesthesia Complications Negative for: history of anesthetic complications  Airway Mallampati: III       Dental   Pulmonary sleep apnea and Continuous Positive Airway Pressure Ventilation , neg COPD, former smoker,           Cardiovascular (-) hypertension(-) Past MI and (-) CHF (-) dysrhythmias (-) Valvular Problems/Murmurs     Neuro/Psych neg Seizures    GI/Hepatic Neg liver ROS, neg GERD  ,  Endo/Other  neg diabetes  Renal/GU negative Renal ROS     Musculoskeletal   Abdominal   Peds  Hematology   Anesthesia Other Findings   Reproductive/Obstetrics                             Anesthesia Physical Anesthesia Plan  ASA: II  Anesthesia Plan: General   Post-op Pain Management:    Induction: Intravenous  PONV Risk Score and Plan: 2 and Dexamethasone, Ondansetron and Treatment may vary due to age or medical condition  Airway Management Planned: LMA and Oral ETT  Additional Equipment:   Intra-op Plan:   Post-operative Plan:   Informed Consent: I have reviewed the patients History and Physical, chart, labs and discussed the procedure including the risks, benefits and alternatives for the proposed anesthesia with the patient or authorized representative who has indicated his/her understanding and acceptance.     Plan Discussed with:   Anesthesia Plan Comments:         Anesthesia Quick Evaluation

## 2017-04-05 NOTE — OR Nursing (Signed)
Patient has pillows in place for elavation  And polar care

## 2017-04-05 NOTE — H&P (Signed)
The patient has been re-examined, and the chart reviewed, and there have been no interval changes to the documented history and physical.    The risks, benefits, and alternatives have been discussed at length, and the patient is willing to proceed.   

## 2017-04-05 NOTE — Anesthesia Post-op Follow-up Note (Signed)
Anesthesia QCDR form completed.        

## 2017-04-05 NOTE — Anesthesia Procedure Notes (Signed)
Procedure Name: LMA Insertion Performed by: Derran Sear, CRNA Pre-anesthesia Checklist: Patient identified, Patient being monitored, Timeout performed, Emergency Drugs available and Suction available Patient Re-evaluated:Patient Re-evaluated prior to induction Oxygen Delivery Method: Circle system utilized Preoxygenation: Pre-oxygenation with 100% oxygen Induction Type: IV induction Ventilation: Mask ventilation without difficulty LMA: LMA inserted LMA Size: 4.5 Tube type: Oral Number of attempts: 1 Placement Confirmation: positive ETCO2 and breath sounds checked- equal and bilateral Tube secured with: Tape Dental Injury: Teeth and Oropharynx as per pre-operative assessment        

## 2017-04-05 NOTE — Discharge Instructions (Signed)
Knee Arthroscopy, Care After Refer to this sheet in the next few weeks. These instructions provide you with information about caring for yourself after your procedure. Your health care provider may also give you more specific instructions. Your treatment has been planned according to current medical practices, but problems sometimes occur. Call your health care provider if you have any problems or questions after your procedure. What can I expect after the procedure? After the procedure, it is common to have:  Soreness.  Pain.  Follow these instructions at home: Bathing  Do not take baths, swim, or use a hot tub until your health care provider approves. Incision care  There are many different ways to close and cover an incision, including stitches, skin glue, and adhesive strips. Follow your health care providers instructions about: ? Incision care. ? Bandage (dressing) changes and removal. ? Incision closure removal.  Check your incision area every day for signs of infection. Watch for: ? Redness, swelling, or pain. ? Fluid, blood, or pus. Activity  Avoid strenuous activities for as long as directed by your health care provider.  Return to your normal activities as directed by your health care provider. Ask your health care provider what activities are safe for you.  Perform range-of-motion exercises only as directed by your health care provider.  Do not lift anything that is heavier than 10 lb (4.5 kg).  Do not drive or operate heavy machinery while taking pain medicine.  If you were given crutches, use them as directed by your health care provider. Managing pain, stiffness, and swelling  If directed, apply ice to the injured area: ? Put ice in a plastic bag. ? Place a towel between your skin and the bag. ? Leave the ice on for 20 minutes, 2-3 times per day.  Raise the injured area above the level of your heart while you are sitting or lying down as directed by your  health care provider. General instructions  Keep all follow-up visits as directed by your health care provider. This is important.  Take medicines only as directed by your health care provider.  Do not use any tobacco products, including cigarettes, chewing tobacco, or electronic cigarettes. If you need help quitting, ask your health care provider.  If you were given compression stockings, wear them as directed by your health care provider. These stockings help prevent blood clots and reduce swelling in your legs. Contact a health care provider if:  You have severe pain with any movement of your knee.  You notice a bad smell coming from the incision or dressing.  You have redness, swelling, or pain at the site of your incision.  You have fluid, blood, or pus coming from your incision. Get help right away if:  You develop a rash.  You have a fever.  You have difficulty breathing or have shortness of breath.  You develop pain in your calves or in the back of your knee.  You develop chest pain.  You develop numbness or tingling in your leg or foot. This information is not intended to replace advice given to you by your health care provider. Make sure you discuss any questions you have with your health care provider. Document Released: 11/04/2004 Document Revised: 09/17/2015 Document Reviewed: 04/13/2014 Elsevier Interactive Patient Education  2017 [employer] AMBULATORY SURGERY  DISCHARGE INSTRUCTIONS   1) The drugs that you were given will stay in your system until tomorrow so for the next 24 hours you should not:  A) Drive  an automobile B) Make any legal decisions C) Drink any alcoholic beverage   2) You may resume regular meals tomorrow.  Today it is better to start with liquids and gradually work up to solid foods.  You may eat anything you prefer, but it is better to start with liquids, then soup and crackers, and gradually work up to solid foods.   3) Please  notify your doctor immediately if you have any unusual bleeding, trouble breathing, redness and pain at the surgery site, drainage, fever, or pain not relieved by medication.   4) Additional Instructions:   Please contact your physician with any problems or Same Day Surgery at [phone], Monday through Friday 6 am to 4 pm, or [place] at [facility] Main number at [phone].

## 2017-04-05 NOTE — Op Note (Signed)
  PATIENT:  Brett West  PRE-OPERATIVE DIAGNOSIS:  TEAR OF MEDIAL MENISCUS  POST-OPERATIVE DIAGNOSIS:  Same  PROCEDURE:  RIGHT KNEE ARTHROSCOPY WITH  Partial MEDIAL MENISECTOMY, synovectomy and chondroplasty of the medial femoral condyle   SURGEON:  Thornton Park, MD  ANESTHESIA:   General  PREOPERATIVE INDICATIONS:  Brett West  46 y.o. male with a diagnosis of TEAR OF MEDIAL MENISCUS, RIGHT KNEE who failed conservative management and elected for surgical management.    The risks benefits and alternatives were discussed with the patient preoperatively including the risks of infection, bleeding, nerve injury, knee stiffness, persistent pain, osteoarthritis and the need for further surgery. Medical  risks include DVT and pulmonary embolism, myocardial infarction, stroke, pneumonia, respiratory failure and death. The patient understood these risks and wished to proceed.  OPERATIVE FINDINGS: complex tear of the right medial meniscus involving the body and posterior horn, chondral flap of the medial femoral condyle and extensive synovitis,  OPERATIVE PROCEDURE: Patient was met in the preoperative area. The operative extremity was signed with the word yes and my initials according the hospital's correct site of surgery protocol.  I answered all questions by the patient and his wife who is at the bedside.  The patient was brought to the operating room where they was placed supine on the operative table. General anesthesia was administered. The patient was prepped and draped in a sterile fashion.  A timeout was performed to verify the patient's name, date of birth, medical record number, correct site of surgery correct procedure to be performed. It was also used to verify the patient received antibiotics that all appropriate instruments, and radiographic studies were available in the room. Once all in attendance were in agreement, the case began.  Proposed arthroscopy incisions were  drawn out with a surgical marker. These were pre-injected with 1% lidocaine plain. An 11 blade was used to establish an inferior lateral and inferomedial portals. The inferomedial portal was created using a 18-gauge spinal needle under direct visualization.  A full diagnostic examination of the knee was performed including the suprapatellar pouch, patellofemoral joint, medial lateral compartments as well as the medial lateral gutters, the intercondylar notch in the posterior knee.  Patient had the meniscal tear treated with a 3.5 resector shaver blade and straight duckbill bitert. The meniscus was debrided until a stable rim was achieved. A chondroplasty of the medial femoral condyle was also performed using a 4-0 resector shaver blade. A partial synovectomy was also performed using a 4-0 resector shaver blade and 90 ArthroCare wand.  The knee was then copiously lavaged. All arthroscopic instruments were removed. The 2 arthroscopy portals were closed with 4-0 nylon. Steri-Strips were applied along with a dry sterile and compressive dressing. The patient was brought to the PACU in stable condition. I was scrubbed and present for the entire case and all sharp and instrument counts were correct at the conclusion the case. I spoke with the patient's wife postoperatively to let her know the case was performed without complication and the patient was stable in the recovery room.    Timoteo Gaul, MD

## 2017-08-06 ENCOUNTER — Encounter: Payer: Self-pay | Admitting: Family Medicine

## 2017-08-06 ENCOUNTER — Ambulatory Visit (INDEPENDENT_AMBULATORY_CARE_PROVIDER_SITE_OTHER): Payer: 59 | Admitting: Family Medicine

## 2017-08-06 VITALS — BP 132/80 | HR 86 | Temp 98.6°F | Resp 16 | Wt 345.0 lb

## 2017-08-06 DIAGNOSIS — R609 Edema, unspecified: Secondary | ICD-10-CM | POA: Diagnosis not present

## 2017-08-06 DIAGNOSIS — L309 Dermatitis, unspecified: Secondary | ICD-10-CM

## 2017-08-06 DIAGNOSIS — R7303 Prediabetes: Secondary | ICD-10-CM

## 2017-08-06 MED ORDER — PHENTERMINE HCL 37.5 MG PO CAPS: 37.5000 mg | ORAL_CAPSULE | ORAL | 2 refills | Status: DC

## 2017-08-06 NOTE — Progress Notes (Signed)
Patient: Brett West Male    DOB: [birth year]   47 y.o.   MRN: [id number] Visit Date: 08/06/2017  Today's Provider: Lelon Huh, MD   Chief Complaint  Patient presents with  . Bleeding/Bruising  . Rash   Subjective:    Rash  This is a recurrent problem. Episode onset: 6 months ago. The problem has been gradually worsening since onset. The affected locations include the left lower leg and right lower leg. The rash is characterized by swelling and redness (painful to touch). Pertinent negatives include no fever, shortness of breath or vomiting. Past treatments include nothing.   He is also interested in trying medication to help lose weight. He had friend that had good results with phentermine and he would like to try it.     Allergies  Allergen Reactions  . Bee Venom Swelling    Difficulty breathing     Current Outpatient Medications:  .  aspirin EC 325 MG tablet, Take 1 tablet (325 mg total) by mouth daily., Disp: 45 tablet, Rfl: 0 .  EPINEPHrine (EPIPEN 2-PAK) 0.3 mg/0.3 mL IJ SOAJ injection, Inject 0.3 mg once into the muscle., Disp: , Rfl:  .  fluticasone (FLONASE) 50 MCG/ACT nasal spray, Place 2 sprays into both nostrils daily., Disp: 16 g, Rfl: 5 .  HYDROcodone-acetaminophen (NORCO) 5-325 MG tablet, Take 1 tablet by mouth every 4 (four) hours as needed for moderate pain., Disp: 30 tablet, Rfl: 0 .  ibuprofen (ADVIL,MOTRIN) 200 MG tablet, Take 400 mg every 6 (six) hours as needed by mouth for headache or moderate pain., Disp: , Rfl:  .  meloxicam (MOBIC) 7.5 MG tablet, Take 7.5 mg 2 (two) times daily by mouth., Disp: , Rfl:  .  ondansetron (ZOFRAN) 4 MG tablet, Take 1 tablet (4 mg total) by mouth every 8 (eight) hours as needed for nausea or vomiting., Disp: 30 tablet, Rfl: 0  Review of Systems  Constitutional: Negative for appetite change, chills and fever.  Respiratory: Negative for chest tightness, shortness of breath and wheezing.   Cardiovascular: Negative  for chest pain and palpitations.  Gastrointestinal: Negative for abdominal pain, nausea and vomiting.  Musculoskeletal: Negative for arthralgias, joint swelling and myalgias.  Skin: Positive for rash.  Hematological: Bruises/bleeds easily (possibly on lower legs).    Social History   Tobacco Use  . Smoking status: Former Smoker    Packs/day: 0.50    Years: 10.00    Pack years: 5.00    Types: Cigarettes    Last attempt to quit: 04/30/2004    Years since quitting: 13.2  . Smokeless tobacco: Never Used  Substance Use Topics  . Alcohol use: No   Objective:   BP 132/80 (BP Location: Left Arm, Patient Position: Sitting, Cuff Size: Large)   Pulse 86   Temp 98.6 F (37 C) (Oral)   Resp 16   Wt (!) 345 lb (156.5 kg)   SpO2 96% Comment: room air  BMI 44.30 kg/m  There were no vitals filed for this visit.   Physical Exam   General Appearance:    Alert, cooperative, no distress, morbidly obese  Eyes:    PERRL, conjunctiva/corneas clear, EOM's intact       Neurologic:   Awake, alert, oriented x 3. No apparent focal neurological           defect.   Skin:   Patches of tender dull red blanching erythema both anterior legs with 2+ bilateral LE edema.  Assessment & Plan:     1. Dermatitis Possible erythema nodusum. Possibly venous stasis dermatitis. Possibly diabetic lipoidicum. Need to rule out autoimmune disorder. Consider trial if furosemide due to underlying edema. Consider dermatology referral.  - ANA,IFA RA Diag Pnl w/rflx Tit/Patn - Sed Rate (ESR) - TSH  2. Edema, unspecified type If labs normal will try diuretic.  - ANA,IFA RA Diag Pnl w/rflx Tit/Patn - Sed Rate (ESR) - TSH  3. Prediabetes  - Hemoglobin A1c  4. Morbid obesity ([place]) Counseled that phentermine is usually ineffective for long term weight loss. Counseled regarding diet and exercise to help maintain weight loss. Counseled regarding potential adverse effects of stimulant medications including  phentermine which he would like to try.  - phentermine 37.5 MG capsule; Take 1 capsule (37.5 mg total) by mouth every morning. Around 10am  Dispense: 30 capsule; Refill: 2        Lelon Huh, MD  [facility]

## 2017-08-08 ENCOUNTER — Telehealth: Payer: Self-pay | Admitting: *Deleted

## 2017-08-08 LAB — ANA,IFA RA DIAG PNL W/RFLX TIT/PATN
ANA Titer 1: NEGATIVE
CYCLIC CITRULLIN PEPTIDE AB: 8 U (ref 0–19)
Rhuematoid fact SerPl-aCnc: <10 IU/mL (ref 0.0–13.9)

## 2017-08-08 LAB — SEDIMENTATION RATE: SED RATE: 13 mm/h (ref 0–15)

## 2017-08-08 LAB — HEMOGLOBIN A1C
ESTIMATED AVERAGE GLUCOSE: 126 mg/dL
Hgb A1c MFr Bld: 6 % — ABNORMAL HIGH (ref 4.8–5.6)

## 2017-08-08 LAB — TSH: TSH: 2.96 u[IU]/mL (ref 0.450–4.500)

## 2017-08-08 MED ORDER — FUROSEMIDE 20 MG PO TABS: 20.0000 mg | ORAL_TABLET | Freq: Every day | ORAL | 1 refills | Status: DC

## 2017-08-08 NOTE — Telephone Encounter (Signed)
-----   Message from Malva Limesonald E Fisher, MD sent at 08/08/2017  1:33 PM EDT ----- Labs all normal. Can try furosemide 20mg  once a day as needed for swelling, #30, rf x 1. If redness does not clear up within a week or two then he will need referral to dermatologist.

## 2017-08-08 NOTE — Telephone Encounter (Signed)
Patient was notified of results. Rx sent to pharmacy. 

## 2017-08-08 NOTE — Telephone Encounter (Signed)
LMOVM for pt to return call 

## 2017-09-19 ENCOUNTER — Telehealth: Payer: Self-pay | Admitting: Family Medicine

## 2017-09-19 DIAGNOSIS — L309 Dermatitis, unspecified: Secondary | ICD-10-CM

## 2017-09-19 NOTE — Telephone Encounter (Signed)
Pt was in three days ago for swelling and rash on his legs  He has finished the water pills,  swelling has gone away but his legs are red and still has the rash.  Please advise [phone]  Thanks Barth Kirks

## 2017-09-20 NOTE — Telephone Encounter (Signed)
If rash has not improved, then need referral to dermatologist, have entered order for Brett West.

## 2017-09-20 NOTE — Telephone Encounter (Signed)
Please advise 

## 2017-09-20 NOTE — Telephone Encounter (Signed)
Patient was advised.  

## 2017-09-20 NOTE — Telephone Encounter (Signed)
Please referral to dermatology. Thanks!

## 2017-10-18 ENCOUNTER — Telehealth: Payer: Self-pay

## 2017-10-18 NOTE — Telephone Encounter (Signed)
Left patient a message to call back. He is scheduled for a CPE tomorrow 10/19/17. His last CPE was on 11/27/16. Just making sure he was aware when last CPE was and to make sure insurance will pay per calender year instead of 365 days.

## 2017-10-19 ENCOUNTER — Ambulatory Visit (INDEPENDENT_AMBULATORY_CARE_PROVIDER_SITE_OTHER): Payer: 59 | Admitting: Family Medicine

## 2017-10-19 ENCOUNTER — Encounter: Payer: Self-pay | Admitting: Family Medicine

## 2017-10-19 VITALS — BP 130/78 | HR 84 | Temp 98.2°F | Ht 74.0 in | Wt 348.2 lb

## 2017-10-19 DIAGNOSIS — Z114 Encounter for screening for human immunodeficiency virus [HIV]: Secondary | ICD-10-CM

## 2017-10-19 DIAGNOSIS — Z0000 Encounter for general adult medical examination without abnormal findings: Secondary | ICD-10-CM | POA: Diagnosis not present

## 2017-10-19 DIAGNOSIS — G4733 Obstructive sleep apnea (adult) (pediatric): Secondary | ICD-10-CM | POA: Diagnosis not present

## 2017-10-19 DIAGNOSIS — R7303 Prediabetes: Secondary | ICD-10-CM

## 2017-10-19 DIAGNOSIS — Z9989 Dependence on other enabling machines and devices: Secondary | ICD-10-CM

## 2017-10-19 NOTE — Progress Notes (Signed)
Patient: Brett West, Male    DOB: [birth year], 47 y.o.   MRN: [id number] Visit Date: 10/19/2017  Today's Provider: Dortha Kern, PA    Chief Complaint  Patient presents with  . Annual Exam   Subjective:    Annual physical exam Brett West is a 47 y.o. male who presents today for health maintenance and complete physical. He feels well. He reports not regularly exercising, but participates in [employer] . He reports he is sleeping well.  ----------------------------------------------------------------- Review of Systems  Constitutional: Negative.   HENT: Negative.   Eyes: Negative.   Respiratory: Positive for apnea.   Cardiovascular: Positive for leg swelling.  Gastrointestinal: Negative.   Endocrine: Negative.   Genitourinary: Negative.   Musculoskeletal: Negative.   Skin: Positive for rash.       Discoloration spots on lower legs.   Allergic/Immunologic: Negative.   Neurological: Negative.   Hematological: Negative.   Psychiatric/Behavioral: Negative.    Social History      He  reports that he quit smoking about 13 years ago. His smoking use included cigarettes. He has a 5.00 pack-year smoking history. He has never used smokeless tobacco. He reports that he does not drink alcohol or use drugs.       Social History   Socioeconomic History  . Marital status: Married    Spouse name: Not on file  . Number of children: 2  . Years of education: Not on file  . Highest education level: Not on file  Occupational History  . Occupation: [employer] [place]  Social Needs  . Financial resource strain: Not on file  . Food insecurity:    Worry: Not on file    Inability: Not on file  . Transportation needs:    Medical: Not on file    Non-medical: Not on file  Tobacco Use  . Smoking status: Former Smoker    Packs/day: 0.50    Years: 10.00    Pack years: 5.00    Types: Cigarettes    Last attempt to quit: 04/30/2004    Years since quitting: 13.4  .  Smokeless tobacco: Never Used  Substance and Sexual Activity  . Alcohol use: No  . Drug use: No  . Sexual activity: Not on file  Lifestyle  . Physical activity:    Days per week: Not on file    Minutes per session: Not on file  . Stress: Not on file  Relationships  . Social connections:    Talks on phone: Not on file    Gets together: Not on file    Attends religious service: Not on file    Active member of club or organization: Not on file    Attends meetings of clubs or organizations: Not on file    Relationship status: Not on file  Other Topics Concern  . Not on file  Social History Narrative   Works for [employer] of [place]   Past Medical History:  Diagnosis Date  . Arthritis    RIGHT KNEE  . History of methicillin resistant staphylococcus aureus (MRSA) 2005  . Sleep apnea    USES CPAP   Patient Active Problem List   Diagnosis Date Noted  . Vasectomy status 12/14/2015  . Prediabetes 11/23/2015  . Low testosterone 09/13/2015  . Allergic rhinitis 03/24/2015  . Allergy to yellow jackets 03/24/2015  . Allergic reaction to bee sting 03/24/2015  . Morbid obesity (HCC) 03/24/2015  . Arthritis, degenerative 03/24/2015  . Recurrent  sinus infections 03/24/2015  . OSA on CPAP 03/24/2015   Past Surgical History:  Procedure Laterality Date  . KNEE ARTHROSCOPY Right 04/05/2017   Procedure: ARTHROSCOPY RIGHT KNEE, PARTIAL MEDIAL MENISECTOMY;  Surgeon: Juanell Fairly, MD;  Location: [facility] ORS;  Service: Orthopedics;  Laterality: Right;  . PLANTAR FASCIECTOMY Right 04/2015  . S/P foot surgery  12/2002  . Sebasceous cyst  03/27/2007   Dr. Michela Pitcher  . VASECTOMY N/A 02/14/2016   Procedure: VASECTOMY;  Surgeon: Vanna Scotland, MD;  Location: [facility] ORS;  Service: Urology;  Laterality: N/A;   Family History        Family Status  Relation Name Status  . Mother  Alive  . Father  Alive  . Brother  Alive  . Neg Hx  (Not Specified)        His family history includes Arthritis in  his father. There is no history of Kidney disease or Prostate cancer.     Allergies  Allergen Reactions  . Bee Venom Swelling    Difficulty breathing    Current Outpatient Medications:  .  aspirin EC 325 MG tablet, Take 1 tablet (325 mg total) by mouth daily., Disp: 45 tablet, Rfl: 0 .  EPINEPHrine (EPIPEN 2-PAK) 0.3 mg/0.3 mL IJ SOAJ injection, Inject 0.3 mg once into the muscle., Disp: , Rfl:  .  fluticasone (FLONASE) 50 MCG/ACT nasal spray, Place 2 sprays into both nostrils daily., Disp: 16 g, Rfl: 5 .  furosemide (LASIX) 20 MG tablet, Take 1 tablet (20 mg total) by mouth daily., Disp: 30 tablet, Rfl: 1 .  ibuprofen (ADVIL,MOTRIN) 200 MG tablet, Take 400 mg every 6 (six) hours as needed by mouth for headache or moderate pain., Disp: , Rfl:  .  meloxicam (MOBIC) 7.5 MG tablet, Take 7.5 mg 2 (two) times daily by mouth., Disp: , Rfl:  .  phentermine 37.5 MG capsule, Take 1 capsule (37.5 mg total) by mouth every morning. Around 10am, Disp: 30 capsule, Rfl: 2   Patient Care Team: Malva Limes, MD as PCP - General (Family Medicine)     Objective:   Vitals: BP 130/78 (BP Location: Right Arm, Patient Position: Sitting, Cuff Size: Large)   Pulse 84   Temp 98.2 F (36.8 C) (Oral)   Ht 6\' 2"  (1.88 m)   Wt (!) 348 lb 3.2 oz (157.9 kg)   SpO2 97%   BMI 44.71 kg/m  Wt Readings from Last 3 Encounters:  10/19/17 (!) 348 lb 3.2 oz (157.9 kg)  08/06/17 (!) 345 lb (156.5 kg)  04/05/17 (!) 335 lb (152 kg)   Physical Exam  Constitutional: He is oriented to person, place, and time. He appears well-developed and well-nourished.  HENT:  Head: Normocephalic and atraumatic.  Right Ear: External ear normal.  Left Ear: External ear normal.  Nose: Nose normal.  Mouth/Throat: Oropharynx is clear and moist.  Eyes: Pupils are equal, round, and reactive to light. Conjunctivae and EOM are normal. Right eye exhibits no discharge.  Neck: Normal range of motion. Neck supple. No tracheal deviation  present. No thyromegaly present.  Cardiovascular: Normal rate, regular rhythm, normal heart sounds and intact distal pulses.  No murmur heard. Pulmonary/Chest: Effort normal and breath sounds normal. No respiratory distress. He has no wheezes. He has no rales. He exhibits no tenderness.  Abdominal: Soft. He exhibits no distension and no mass. There is no tenderness. There is no rebound and no guarding.  Genitourinary: Rectum normal and prostate normal. Rectal exam shows guaiac negative stool.  Genitourinary Comments: Difficult to examine due to obesity.  Musculoskeletal: Normal range of motion. He exhibits no edema or tenderness.  Lymphadenopathy:    He has no cervical adenopathy.  Neurological: He is alert and oriented to person, place, and time. He has normal reflexes. He displays normal reflexes. No cranial nerve deficit. He exhibits normal muscle tone. Coordination normal.  Skin: Skin is warm and dry. No rash noted. No erythema.  Psychiatric: He has a normal mood and affect. His behavior is normal. Judgment and thought content normal.    Depression Screen PHQ 2/9 Scores 10/19/2017 11/27/2016 11/23/2015  PHQ - 2 Score 0 0 0  PHQ- 9 Score 0 2 4   Assessment & Plan:     Routine Health Maintenance and Physical Exam  Exercise Activities and Dietary recommendations Goals    Need to lower caloric intake and exercise 30 minutes 4-5 days a week to lose weight.      Immunization History  Administered Date(s) Administered  . Influenza,inj,Quad PF,6+ Mos 03/02/2015  . Tdap 11/23/2015    Health Maintenance  Topic Date Due  . HIV Screening  12/04/1985  . INFLUENZA VACCINE  11/29/2017  . TETANUS/TDAP  11/22/2025    Discussed health benefits of physical activity, and encouraged him to engage in regular exercise appropriate for his age and condition.    --------------------------------------------------------------------  1. Annual physical exam General health stable with obesity and  suspect stasis dermatitis of lower legs. Immunizations up to date. Recheck routine labs and follow up pending reports. Will complete [employer] physical form as soon as lab reports available. Given anticipatory counseling. - Lipid Profile - CBC with Differential - TSH - Comprehensive Metabolic Panel (CMET) - HgB A1c  2. Encounter for screening for HIV - HIV antibody  3. OSA on CPAP Sleeps with CPAP every night. Suspect he has a self-titration machine. Check routine labs. - CBC with Differential  4. Morbid obesity (HCC) BMI 44+ and little to no changes with the use of Phentermine. Must work on lifestyle changes in diet and exercise. Recheck labs for other metabolic disorders. - Lipid Profile - CBC with Differential - TSH - Comprehensive Metabolic Panel (CMET) - HgB A1c  5. Prediabetes Hgb A1C on 08-07-17 was 6.0%. Has gained 3 more pounds. Recommend reduced calorie diet, regular exercise to lower Hgb A1C and controlling prediabetes. Recheck labs. - Lipid Profile - CBC with Differential - Comprehensive Metabolic Panel (CMET) - HgB A1c  Dortha Kernennis Chrismon, PA  [facility]

## 2017-10-31 ENCOUNTER — Other Ambulatory Visit: Payer: Self-pay | Admitting: Family Medicine

## 2017-11-01 LAB — COMPREHENSIVE METABOLIC PANEL
A/G RATIO: 1.8 (ref 1.2–2.2)
ALT: 54 IU/L — ABNORMAL HIGH (ref 0–44)
AST: 40 IU/L (ref 0–40)
Albumin: 4.3 g/dL (ref 3.5–5.5)
Alkaline Phosphatase: 79 IU/L (ref 39–117)
BUN/Creatinine Ratio: 11 (ref 9–20)
BUN: 14 mg/dL (ref 6–24)
Bilirubin Total: 0.7 mg/dL (ref 0.0–1.2)
CALCIUM: 9.2 mg/dL (ref 8.7–10.2)
CO2: 22 mmol/L (ref 20–29)
CREATININE: 1.32 mg/dL — AB (ref 0.76–1.27)
Chloride: 103 mmol/L (ref 96–106)
GFR, EST AFRICAN AMERICAN: 74 mL/min/{1.73_m2} (ref >59)
GFR, EST NON AFRICAN AMERICAN: 64 mL/min/{1.73_m2} (ref >59)
GLOBULIN, TOTAL: 2.4 g/dL (ref 1.5–4.5)
Glucose: 101 mg/dL — ABNORMAL HIGH (ref 65–99)
POTASSIUM: 4.4 mmol/L (ref 3.5–5.2)
SODIUM: 139 mmol/L (ref 134–144)
Total Protein: 6.7 g/dL (ref 6.0–8.5)

## 2017-11-01 LAB — CBC WITH DIFFERENTIAL/PLATELET
BASOS: 1 %
Basophils Absolute: 0.1 10*3/uL (ref 0.0–0.2)
EOS (ABSOLUTE): 0.1 10*3/uL (ref 0.0–0.4)
EOS: 1 %
Hematocrit: 44.5 % (ref 37.5–51.0)
Hemoglobin: 15 g/dL (ref 13.0–17.7)
IMMATURE GRANULOCYTES: 0 %
Immature Grans (Abs): 0 10*3/uL (ref 0.0–0.1)
Lymphocytes Absolute: 2.6 10*3/uL (ref 0.7–3.1)
Lymphs: 41 %
MCH: 27.3 pg (ref 26.6–33.0)
MCHC: 33.7 g/dL (ref 31.5–35.7)
MCV: 81 fL (ref 79–97)
MONOS ABS: 0.5 10*3/uL (ref 0.1–0.9)
Monocytes: 8 %
NEUTROS PCT: 49 %
Neutrophils Absolute: 3 10*3/uL (ref 1.4–7.0)
Platelets: 226 10*3/uL (ref 150–450)
RBC: 5.5 x10E6/uL (ref 4.14–5.80)
RDW: 14.1 % (ref 12.3–15.4)
WBC: 6.2 10*3/uL (ref 3.4–10.8)

## 2017-11-01 LAB — HEMOGLOBIN A1C
Est. average glucose Bld gHb Est-mCnc: 123 mg/dL
HEMOGLOBIN A1C: 5.9 % — AB (ref 4.8–5.6)

## 2017-11-01 LAB — LIPID PANEL
CHOL/HDL RATIO: 5.6 ratio — AB (ref 0.0–5.0)
Cholesterol, Total: 178 mg/dL (ref 100–199)
HDL: 32 mg/dL — ABNORMAL LOW (ref >39)
LDL CALC: 113 mg/dL — AB (ref 0–99)
TRIGLYCERIDES: 167 mg/dL — AB (ref 0–149)
VLDL Cholesterol Cal: 33 mg/dL (ref 5–40)

## 2017-11-01 LAB — TSH: TSH: 2.86 u[IU]/mL (ref 0.450–4.500)

## 2017-11-01 LAB — HIV ANTIBODY (ROUTINE TESTING W REFLEX): HIV SCREEN 4TH GENERATION: NONREACTIVE

## 2017-11-02 ENCOUNTER — Telehealth: Payer: Self-pay

## 2017-11-02 NOTE — Telephone Encounter (Signed)
LMTCB

## 2017-11-02 NOTE — Telephone Encounter (Signed)
Patient advised. Physical form faxed back to [facility] per patient's request. Original copies left at front desk for pick up.

## 2017-11-02 NOTE — Telephone Encounter (Signed)
-----   Message from Tamsen Roersennis E Chrismon, [place] sent at 11/02/2017  8:36 AM EDT ----- Hgb A1C still in the prediabetes range with some slight improvement in cholesterol and triglycerides. Recommend Red Yeast Rice with Co-Q 10 daily (supplements) or Pravastatin 20 mg qd. Continue efforts to lose weight and recheck these levels in 3 months.

## 2017-11-11 ENCOUNTER — Other Ambulatory Visit: Payer: Self-pay | Admitting: Family Medicine

## 2017-11-26 ENCOUNTER — Ambulatory Visit: Payer: 59 | Admitting: Family Medicine

## 2017-12-02 ENCOUNTER — Other Ambulatory Visit: Payer: Self-pay | Admitting: Family Medicine

## 2018-01-02 ENCOUNTER — Other Ambulatory Visit: Payer: Self-pay | Admitting: Family Medicine

## 2018-01-02 MED ORDER — PHENTERMINE HCL 37.5 MG PO CAPS: 37.5000 mg | ORAL_CAPSULE | ORAL | 2 refills | Status: DC

## 2018-01-02 MED ORDER — FUROSEMIDE 20 MG PO TABS: 20.0000 mg | ORAL_TABLET | Freq: Every day | ORAL | 3 refills | Status: DC

## 2018-01-02 NOTE — Telephone Encounter (Signed)
Pt needs refill on   Phentermine 37.5  Furosemide 20 mg  Please send to [facility]  CB# [phone]  Thanks Barth Kirks

## 2018-01-24 ENCOUNTER — Telehealth: Payer: Self-pay | Admitting: Family Medicine

## 2018-01-24 NOTE — Telephone Encounter (Signed)
Please refill these at [facility] on [address]    Instead of [facility].    They are much cheaper at [facility].

## 2018-01-24 NOTE — Telephone Encounter (Signed)
[employer] employment form being sent back for completion.

## 2018-01-25 NOTE — Telephone Encounter (Signed)
Refilled by Dr. Sherrie Mustache on 01-02-18 at [facility]. Patient should check with [facility] pharmacy.

## 2018-01-25 NOTE — Telephone Encounter (Signed)
I haven't seen any forms. May have been sent to Dr. Sherrie Mustache.

## 2018-03-06 ENCOUNTER — Ambulatory Visit: Payer: BLUE CROSS/BLUE SHIELD | Admitting: Family Medicine

## 2018-03-06 ENCOUNTER — Encounter: Payer: Self-pay | Admitting: Family Medicine

## 2018-03-06 VITALS — BP 118/82 | HR 98 | Temp 97.9°F | Resp 16 | Wt 300.0 lb

## 2018-03-06 DIAGNOSIS — E669 Obesity, unspecified: Secondary | ICD-10-CM | POA: Diagnosis not present

## 2018-03-06 DIAGNOSIS — Z23 Encounter for immunization: Secondary | ICD-10-CM

## 2018-03-06 DIAGNOSIS — R609 Edema, unspecified: Secondary | ICD-10-CM

## 2018-03-06 DIAGNOSIS — E781 Pure hyperglyceridemia: Secondary | ICD-10-CM | POA: Diagnosis not present

## 2018-03-06 DIAGNOSIS — Z6838 Body mass index (BMI) 38.0-38.9, adult: Secondary | ICD-10-CM

## 2018-03-06 DIAGNOSIS — R7303 Prediabetes: Secondary | ICD-10-CM

## 2018-03-06 LAB — POCT GLYCOSYLATED HEMOGLOBIN (HGB A1C): HEMOGLOBIN A1C: 5.4 % (ref 4.0–5.6)

## 2018-03-06 MED ORDER — FUROSEMIDE 20 MG PO TABS: 20.0000 mg | ORAL_TABLET | Freq: Every day | ORAL | 3 refills | Status: DC

## 2018-03-06 NOTE — Addendum Note (Signed)
Addended by: Kavin Leech E on: 03/06/2018 11:17 AM   Modules accepted: Orders

## 2018-03-06 NOTE — Progress Notes (Signed)
Patient: Brett West Male    DOB: Feb 14, 1971   47 y.o.   MRN: [id number] Visit Date: 03/06/2018  Today's Provider: Mila Merry, MD   Chief Complaint  Patient presents with  . Hyperlipidemia  . Hyperglycemia   Subjective:    Hyperlipidemia  The problem is controlled. Recent lipid tests were reviewed and are high. Pertinent negatives include no chest pain, focal sensory loss, focal weakness, leg pain, myalgias or shortness of breath. Current antihyperlipidemic treatment includes diet change and exercise. There are no compliance problems.     Prediabetes, Follow-up:   Lab Results  Component Value Date   HGBA1C 5.9 (H) 10/31/2017   HGBA1C 6.0 (H) 08/06/2017   HGBA1C 5.7 (H) 11/27/2016   GLUCOSE 101 (H) 10/31/2017   GLUCOSE 117 (H) 04/05/2017   GLUCOSE 103 (H) 11/27/2016    Last seen for for this6 months ago.  Management since that visit includes Work on lifestyle changes. Current symptoms include none and have been stable.  Weight trend: decreasing steadily Prior visit with dietician: no Current diet: in general, a "healthy" diet   Current exercise: some   He states he has been walking more throughout his daily routine. Has changed diet and avoids sweets, breads and fried foods. Is taking phentermine intermittently, usually a week on and a week off. He feels well, is more energetic and feels like he can continue on current diet.    Pertinent Labs:    Component Value Date/Time   CHOL 178 10/31/2017 0932   TRIG 167 (H) 10/31/2017 0932   CHOLHDL 5.6 (H) 10/31/2017 0932   CREATININE 1.32 (H) 10/31/2017 0932    Wt Readings from Last 3 Encounters:  03/06/18 300 lb (136.1 kg)  10/19/17 (!) 348 lb 3.2 oz (157.9 kg)  08/06/17 (!) 345 lb (156.5 kg)    Has been taking furosemide for leg swelling since April and reports swelling has significantly improved. Is no longer weeping, but still gets red. Has also been seeing dermatologist and states was advised to  consider increasing furosemide.        Allergies  Allergen Reactions  . Bee Venom Swelling    Difficulty breathing     Current Outpatient Medications:  .  aspirin EC 325 MG tablet, Take 1 tablet (325 mg total) by mouth daily., Disp: 45 tablet, Rfl: 0 .  EPINEPHrine (EPIPEN 2-PAK) 0.3 mg/0.3 mL IJ SOAJ injection, Inject 0.3 mg once into the muscle., Disp: , Rfl:  .  fluticasone (FLONASE) 50 MCG/ACT nasal spray, Place 2 sprays into both nostrils daily., Disp: 16 g, Rfl: 5 .  furosemide (LASIX) 20 MG tablet, Take 1 tablet (20 mg total) by mouth daily., Disp: 30 tablet, Rfl: 3 .  ibuprofen (ADVIL,MOTRIN) 200 MG tablet, Take 400 mg every 6 (six) hours as needed by mouth for headache or moderate pain., Disp: , Rfl:  .  meloxicam (MOBIC) 7.5 MG tablet, Take 7.5 mg 2 (two) times daily by mouth., Disp: , Rfl:  .  phentermine 37.5 MG capsule, Take 1 capsule (37.5 mg total) by mouth every morning. Around 10am, Disp: 30 capsule, Rfl: 2  Review of Systems  Constitutional: Negative.   Respiratory: Negative.  Negative for shortness of breath.   Cardiovascular: Negative.  Negative for chest pain.  Gastrointestinal: Negative.   Musculoskeletal: Negative.  Negative for myalgias.  Skin: Positive for color change. Negative for pallor, rash and wound.  Neurological: Negative for dizziness, focal weakness, light-headedness and headaches.  Social History   Tobacco Use  . Smoking status: Former Smoker    Packs/day: 0.50    Years: 10.00    Pack years: 5.00    Types: Cigarettes    Last attempt to quit: 04/30/2004    Years since quitting: 13.8  . Smokeless tobacco: Never Used  Substance Use Topics  . Alcohol use: No   Objective:   BP 118/82 (BP Location: Right Arm, Patient Position: Sitting, Cuff Size: Large)   Pulse 98   Temp 97.9 F (36.6 C) (Oral)   Resp 16   Wt 300 lb (136.1 kg)   BMI 38.52 kg/m     Physical Exam  General Appearance:    Alert, cooperative, no distress, obese    Eyes:    PERRL, conjunctiva/corneas clear, EOM's intact       Lungs:     Clear to auscultation bilaterally, respirations unlabored  Heart:    Regular rate and rhythm  Neurologic:   Awake, alert, oriented x 3. No apparent focal neurological           defect.   Ext:  3+ bilateral LE edema.    Results for orders placed or performed in visit on 03/06/18  POCT glycosylated hemoglobin (Hb A1C)  Result Value Ref Range   Hemoglobin A1C 5.4 4.0 - 5.6 %       Assessment & Plan:     1. Prediabetes Significantly improved with weight loss.  - POCT glycosylated hemoglobin (Hb A1C)  2. Hypertriglyceridemia  - Lipid panel - Comprehensive metabolic panel  3. Edema, unspecified type Much better with weight loss and furosemide. Consider taking up to 2 x 20mg  furosemide daily .  4. Class 2 obesity with body mass index (BMI) of 38.0 to 38.9 in adult, unspecified obesity type, unspecified whether serious comorbidity present 48 pound weight loss noted with intermittent phentermine, diet and increased physical activity. Continue current medications.  Follow up CPE in June.        Mila Merry, MD  [facility]

## 2018-03-07 ENCOUNTER — Telehealth: Payer: Self-pay

## 2018-03-07 ENCOUNTER — Other Ambulatory Visit: Payer: Self-pay | Admitting: Family Medicine

## 2018-03-07 LAB — COMPREHENSIVE METABOLIC PANEL
ALBUMIN: 4.4 g/dL (ref 3.5–5.5)
ALK PHOS: 89 IU/L (ref 39–117)
ALT: 41 IU/L (ref 0–44)
AST: 27 IU/L (ref 0–40)
Albumin/Globulin Ratio: 1.9 (ref 1.2–2.2)
BUN / CREAT RATIO: 18 (ref 9–20)
BUN: 22 mg/dL (ref 6–24)
Bilirubin Total: 0.6 mg/dL (ref 0.0–1.2)
CO2: 24 mmol/L (ref 20–29)
CREATININE: 1.2 mg/dL (ref 0.76–1.27)
Calcium: 9.3 mg/dL (ref 8.7–10.2)
Chloride: 102 mmol/L (ref 96–106)
GFR, EST AFRICAN AMERICAN: 83 mL/min/{1.73_m2} (ref >59)
GFR, EST NON AFRICAN AMERICAN: 72 mL/min/{1.73_m2} (ref >59)
GLOBULIN, TOTAL: 2.3 g/dL (ref 1.5–4.5)
Glucose: 95 mg/dL (ref 65–99)
Potassium: 4.5 mmol/L (ref 3.5–5.2)
Sodium: 140 mmol/L (ref 134–144)
TOTAL PROTEIN: 6.7 g/dL (ref 6.0–8.5)

## 2018-03-07 LAB — LIPID PANEL
CHOLESTEROL TOTAL: 182 mg/dL (ref 100–199)
Chol/HDL Ratio: 4.8 ratio (ref 0.0–5.0)
HDL: 38 mg/dL — AB (ref >39)
LDL Calculated: 115 mg/dL — ABNORMAL HIGH (ref 0–99)
Triglycerides: 143 mg/dL (ref 0–149)
VLDL CHOLESTEROL CAL: 29 mg/dL (ref 5–40)

## 2018-03-07 MED ORDER — FUROSEMIDE 20 MG PO TABS: 20.0000 mg | ORAL_TABLET | Freq: Every day | ORAL | 3 refills | Status: DC

## 2018-03-07 NOTE — Telephone Encounter (Signed)
-----   Message from Malva Limes, MD sent at 03/07/2018  8:00 AM EST ----- Triglycerides are better, down from 167 to 143. Cholesterol is stable. Check yearly.

## 2018-03-07 NOTE — Telephone Encounter (Signed)
Pt advised.   Thanks,   -Skip Litke  

## 2018-05-09 ENCOUNTER — Telehealth: Payer: Self-pay | Admitting: Family Medicine

## 2018-05-09 NOTE — Telephone Encounter (Signed)
B2 form dropped off on 05/09/2018.  Pt requesting call when ready for pick up.

## 2018-05-13 NOTE — Telephone Encounter (Signed)
Form completed.

## 2018-05-13 NOTE — Telephone Encounter (Signed)
Form is up front ready for pick up.  Tried calling pt but his mailbox is full.   Thanks,   -Vernona Rieger

## 2018-05-17 ENCOUNTER — Encounter: Payer: Self-pay | Admitting: Family Medicine

## 2018-05-17 ENCOUNTER — Ambulatory Visit: Payer: BLUE CROSS/BLUE SHIELD | Admitting: Family Medicine

## 2018-05-17 VITALS — BP 124/70 | HR 73 | Temp 98.2°F | Resp 16 | Wt 305.0 lb

## 2018-05-17 DIAGNOSIS — R350 Frequency of micturition: Secondary | ICD-10-CM

## 2018-05-17 DIAGNOSIS — Z111 Encounter for screening for respiratory tuberculosis: Secondary | ICD-10-CM | POA: Diagnosis not present

## 2018-05-17 DIAGNOSIS — Z87898 Personal history of other specified conditions: Secondary | ICD-10-CM | POA: Diagnosis not present

## 2018-05-17 DIAGNOSIS — R252 Cramp and spasm: Secondary | ICD-10-CM

## 2018-05-17 LAB — POCT URINALYSIS DIPSTICK
BILIRUBIN UA: NEGATIVE
GLUCOSE UA: NEGATIVE
KETONES UA: NEGATIVE
Leukocytes, UA: NEGATIVE
Nitrite, UA: NEGATIVE
Protein, UA: NEGATIVE
RBC UA: NEGATIVE
SPEC GRAV UA: 1.015 (ref 1.010–1.025)
UROBILINOGEN UA: 0.2 U/dL
pH, UA: 6 (ref 5.0–8.0)

## 2018-05-17 NOTE — Progress Notes (Signed)
Patient: Brett West Male    DOB: Jun 05, 1970   48 y.o.   MRN: [id number] Visit Date: 05/17/2018  Today's Provider: Dortha Kern, PA   Chief Complaint  Patient presents with  . Urinary Frequency   Subjective:     Urinary Frequency   This is a new problem. The current episode started 1 to 4 weeks ago. The problem has been unchanged. The patient is experiencing no pain. There has been no fever. Associated symptoms include frequency. Pertinent negatives include no flank pain, hematuria, sweats or urgency. Treatments tried: Takes Lasix at night time and cup of tea.   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  Past Medical History:  Diagnosis Date  . Arthritis    RIGHT KNEE  . History of methicillin resistant staphylococcus aureus (MRSA) 2005  . Sleep apnea    USES CPAP   Past Surgical History:  Procedure Laterality Date  . KNEE ARTHROSCOPY Right 04/05/2017   Procedure: ARTHROSCOPY RIGHT KNEE, PARTIAL MEDIAL MENISECTOMY;  Surgeon: Juanell Fairly, MD;  Location: [facility] ORS;  Service: Orthopedics;  Laterality: Right;  . PLANTAR FASCIECTOMY Right 04/2015  . S/P foot surgery  12/2002  . Sebasceous cyst  03/27/2007   Dr. Michela Pitcher  . VASECTOMY N/A 02/14/2016   Procedure: VASECTOMY;  Surgeon: Vanna Scotland, MD;  Location: [facility] ORS;  Service: Urology;  Laterality: N/A;   Family History  Problem Relation Age of Onset  . Arthritis Father   . Kidney disease Neg Hx   . Prostate cancer Neg Hx    Allergies  Allergen Reactions  . Bee Venom Swelling    Difficulty breathing    Current Outpatient Medications:  .  furosemide (LASIX) 20 MG tablet, Take 1-2 tablets (20-40 mg total) by mouth daily. For swelling, Disp: 60 tablet, Rfl: 3 .  ibuprofen (ADVIL,MOTRIN) 200 MG tablet, Take 400 mg every 6 (six) hours as needed by mouth for headache or moderate pain., Disp: , Rfl:  .  phentermine 37.5 MG capsule, Take 1 capsule (37.5 mg total) by mouth every  morning. Around 10am, Disp: 30 capsule, Rfl: 2 .  EPINEPHrine (EPIPEN 2-PAK) 0.3 mg/0.3 mL IJ SOAJ injection, Inject 0.3 mg once into the muscle., Disp: , Rfl:  .  meloxicam (MOBIC) 7.5 MG tablet, Take 7.5 mg 2 (two) times daily by mouth., Disp: , Rfl:   Review of Systems  Constitutional: Negative.   HENT: Negative.   Respiratory: Negative.   Gastrointestinal: Negative.   Genitourinary: Positive for frequency. Negative for decreased urine volume, difficulty urinating, dysuria, enuresis, flank pain, hematuria and urgency.   Social History   Tobacco Use  . Smoking status: Former Smoker    Packs/day: 0.50    Years: 10.00    Pack years: 5.00    Types: Cigarettes    Last attempt to quit: 04/30/2004    Years since quitting: 14.0  . Smokeless tobacco: Never Used  Substance Use Topics  . Alcohol use: No     Objective:   BP 124/70 (BP Location: Right Arm, Patient Position: Sitting, Cuff Size: Large)   Pulse 73   Temp 98.2 F (36.8 C) (Oral)   Resp 16   Wt (!) 305 lb (138.3 kg)   SpO2 98%   BMI 39.16 kg/m    Wt Readings from Last 3 Encounters:  05/17/18 (!) 305 lb (138.3 kg)  03/06/18 300 lb (136.1 kg)  10/19/17 (!) 348 lb 3.2 oz (157.9  kg)   Vitals:   05/17/18 0819  BP: 124/70  Pulse: 73  Resp: 16  Temp: 98.2 F (36.8 C)  TempSrc: Oral  SpO2: 98%  Weight: (!) 305 lb (138.3 kg)   Physical Exam Constitutional:      General: He is not in acute distress.    Appearance: He is well-developed.  HENT:     Head: Normocephalic and atraumatic.     Right Ear: Hearing normal.     Left Ear: Hearing normal.     Nose: Nose normal.  Eyes:     General: Lids are normal. No scleral icterus.       Right eye: No discharge.        Left eye: No discharge.     Conjunctiva/sclera: Conjunctivae normal.  Neck:     Musculoskeletal: Normal range of motion and neck supple.  Cardiovascular:     Rate and Rhythm: Normal rate and regular rhythm.  Pulmonary:     Effort: Pulmonary effort is  normal. No respiratory distress.     Breath sounds: Normal breath sounds.  Abdominal:     General: Bowel sounds are normal.     Palpations: Abdomen is soft.  Musculoskeletal: Normal range of motion.     Right lower leg: Edema present.     Left lower leg: Edema present.     Comments: Stasis dermatitis improving both lower legs.  Skin:    Findings: No lesion or rash.  Neurological:     Mental Status: He is alert and oriented to person, place, and time.  Psychiatric:        Speech: Speech normal.        Behavior: Behavior normal.        Thought Content: Thought content normal.       Assessment & Plan    1. Frequent urination Noticing more nocturnal frequency since he switched his Lasix to evening dosing instead of taking it in the mornings for peripheral edema. No daytime frequency or signs of prostate obstruction (hesitancy, decreased stream, dribbling, etc). Urinalysis negative for infection. Will check CBC and CMP. Recommend he go back to morning dosing of the Lasix. Recheck pending lab reports. - POCT urinalysis dipstick - CBC with Differential/Platelet - Comprehensive metabolic panel  2. History of peripheral edema Feeling much better since losing 40+ lbs in the past year. Stasis dermatitis has stopped weeping and erythema fading. No induration or tenderness of lower legs remaining. Recheck labs and follow up pending reports. No chest pains, palpitations or dyspnea. - CBC with Differential/Platelet - Comprehensive metabolic panel  3. Screening-pulmonary TB No pulmonary symptoms. 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 - QuantiFERON-TB Gold Plus  4. Muscle cramps Has had nocturnal leg cramps recently. With daily use of Lasix, will check labs for hypokalemia and hypovolemia. - CBC with Differential/Platelet - Comprehensive metabolic panel     Dortha Kern, PA  [facility]

## 2018-05-21 LAB — CBC WITH DIFFERENTIAL/PLATELET
Basophils Absolute: 0.1 10*3/uL (ref 0.0–0.2)
Basos: 1 %
EOS (ABSOLUTE): 0.1 10*3/uL (ref 0.0–0.4)
EOS: 1 %
HEMATOCRIT: 48.1 % (ref 37.5–51.0)
Hemoglobin: 15.8 g/dL (ref 13.0–17.7)
Immature Grans (Abs): 0 10*3/uL (ref 0.0–0.1)
Immature Granulocytes: 0 %
Lymphocytes Absolute: 3.3 10*3/uL — ABNORMAL HIGH (ref 0.7–3.1)
Lymphs: 45 %
MCH: 27.2 pg (ref 26.6–33.0)
MCHC: 32.8 g/dL (ref 31.5–35.7)
MCV: 83 fL (ref 79–97)
Monocytes Absolute: 0.6 10*3/uL (ref 0.1–0.9)
Monocytes: 8 %
Neutrophils Absolute: 3.4 10*3/uL (ref 1.4–7.0)
Neutrophils: 45 %
Platelets: 249 10*3/uL (ref 150–450)
RBC: 5.8 x10E6/uL (ref 4.14–5.80)
RDW: 13.6 % (ref 11.6–15.4)
WBC: 7.3 10*3/uL (ref 3.4–10.8)

## 2018-05-21 LAB — QUANTIFERON-TB GOLD PLUS
QUANTIFERON TB1 AG VALUE: 0.02 [IU]/mL
QuantiFERON Mitogen Value: >10 IU/mL
QuantiFERON Nil Value: 0.02 IU/mL
QuantiFERON TB2 Ag Value: 0.02 IU/mL
QuantiFERON-TB Gold Plus: NEGATIVE

## 2018-05-21 LAB — COMPREHENSIVE METABOLIC PANEL
ALT: 27 IU/L (ref 0–44)
AST: 20 IU/L (ref 0–40)
Albumin/Globulin Ratio: 1.8 (ref 1.2–2.2)
Albumin: 4.5 g/dL (ref 3.5–5.5)
Alkaline Phosphatase: 85 IU/L (ref 39–117)
BUN/Creatinine Ratio: 18 (ref 9–20)
BUN: 24 mg/dL (ref 6–24)
Bilirubin Total: 0.3 mg/dL (ref 0.0–1.2)
CO2: 24 mmol/L (ref 20–29)
Calcium: 9.6 mg/dL (ref 8.7–10.2)
Chloride: 101 mmol/L (ref 96–106)
Creatinine, Ser: 1.35 mg/dL — ABNORMAL HIGH (ref 0.76–1.27)
GFR calc Af Amer: 72 mL/min/{1.73_m2} (ref >59)
GFR calc non Af Amer: 62 mL/min/{1.73_m2} (ref >59)
Globulin, Total: 2.5 g/dL (ref 1.5–4.5)
Glucose: 100 mg/dL — ABNORMAL HIGH (ref 65–99)
POTASSIUM: 5 mmol/L (ref 3.5–5.2)
Sodium: 140 mmol/L (ref 134–144)
Total Protein: 7 g/dL (ref 6.0–8.5)

## 2018-05-24 ENCOUNTER — Telehealth: Payer: Self-pay

## 2018-05-24 NOTE — Telephone Encounter (Signed)
LMTCB

## 2018-05-24 NOTE — Telephone Encounter (Signed)
Pt advised.   Thanks,   -Riana Tessmer  

## 2018-05-24 NOTE — Telephone Encounter (Signed)
-----   Message from Tamsen Roers, [place] sent at 05/24/2018  8:01 AM EST ----- TB test is negative. 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

## 2018-10-29 ENCOUNTER — Other Ambulatory Visit: Payer: Self-pay

## 2018-10-29 MED ORDER — FUROSEMIDE 20 MG PO TABS: 20.0000 mg | ORAL_TABLET | Freq: Every day | ORAL | 0 refills | Status: DC

## 2018-10-29 MED ORDER — PHENTERMINE HCL 37.5 MG PO CAPS: 37.5000 mg | ORAL_CAPSULE | ORAL | 0 refills | Status: DC

## 2018-12-12 ENCOUNTER — Other Ambulatory Visit: Payer: Self-pay | Admitting: Family Medicine

## 2019-02-18 ENCOUNTER — Other Ambulatory Visit: Payer: Self-pay | Admitting: Family Medicine

## 2019-04-28 ENCOUNTER — Telehealth: Payer: Self-pay | Admitting: Family Medicine

## 2019-04-28 DIAGNOSIS — J3089 Other allergic rhinitis: Secondary | ICD-10-CM

## 2019-04-28 MED ORDER — PHENTERMINE HCL 37.5 MG PO CAPS: ORAL_CAPSULE | ORAL | 0 refills | Status: DC

## 2019-04-28 MED ORDER — FUROSEMIDE 20 MG PO TABS: 20.0000 mg | ORAL_TABLET | Freq: Every day | ORAL | 0 refills | Status: DC

## 2019-04-28 NOTE — Addendum Note (Signed)
Addended by: Birdie Sons on: 04/28/2019 10:13 PM   Modules accepted: Orders

## 2019-04-28 NOTE — Telephone Encounter (Signed)
Medication Refill - Medication:  Singulair  Water Pill Weight Loss pill  (Pt does not know the names of his Rx)   Has the patient contacted their pharmacy? Yes.   (Agent: If no, request that the patient contact the pharmacy for the refill.) (Agent: If yes, when and what did the pharmacy advise?)  Preferred Pharmacy (with phone number or street name):  [facility] [address] [place]  [place] [address]  Phone: [phone] Fax: [phone]     Agent: Please be advised that RX refills may take up to 3 business days. We ask that you follow-up with your pharmacy.

## 2019-04-29 MED ORDER — MONTELUKAST SODIUM 10 MG PO TABS: 10.0000 mg | ORAL_TABLET | Freq: Every day | ORAL | 5 refills | Status: AC

## 2019-04-29 NOTE — Telephone Encounter (Addendum)
Pt would like the  montelukast (SINGULAIR) 10 MG tablet Refilled and would like a call back to confirm he can get this med  Last refilled 12/2016  [place], [name] Phone:  [phone]  Fax:  [phone]

## 2019-04-29 NOTE — Telephone Encounter (Signed)
From [facility] 

## 2019-04-29 NOTE — Addendum Note (Signed)
Addended by: Birdie Sons on: 04/29/2019 09:57 PM   Modules accepted: Orders

## 2019-08-06 ENCOUNTER — Other Ambulatory Visit: Payer: Self-pay | Admitting: Family Medicine

## 2019-08-06 NOTE — Telephone Encounter (Signed)
Requested Prescriptions  Pending Prescriptions Disp Refills  . furosemide (LASIX) 20 MG tablet [Pharmacy Med Name: Furosemide 20 MG Oral Tablet] 30 tablet 0    Sig: Take 1 tablet by mouth once daily     Cardiovascular:  Diuretics - Loop Failed - 08/06/2019  6:03 PM      Failed - K in normal range and within 360 days    Potassium  Date Value Ref Range Status  05/17/2018 5.0 3.5 - 5.2 mmol/L Final         Failed - Ca in normal range and within 360 days    Calcium  Date Value Ref Range Status  05/17/2018 9.6 8.7 - 10.2 mg/dL Final         Failed - Na in normal range and within 360 days    Sodium  Date Value Ref Range Status  05/17/2018 140 134 - 144 mmol/L Final         Failed - Cr in normal range and within 360 days    Creatinine, Ser  Date Value Ref Range Status  05/17/2018 1.35 (H) 0.76 - 1.27 mg/dL Final         Failed - Valid encounter within last 6 months    Recent Outpatient Visits          1 year ago Frequent urination   [facility] Chrismon, Jodell Cipro, [place]   1 year ago Prediabetes   [facility] Malva Limes, MD   1 year ago Annual physical exam   [facility] Chrismon, Jodell Cipro, [place]   2 years ago Dermatitis   [facility] Malva Limes, MD   2 years ago Acute frontal sinusitis, recurrence not specified   [facility] Malva Limes, MD             Passed - Last BP in normal range    BP Readings from Last 1 Encounters:  05/17/18 124/70         Called patient Left VM for him to return call to office for appointment. Needs labs. Gave 30 day courtesy refill

## 2019-09-03 ENCOUNTER — Other Ambulatory Visit: Payer: Self-pay

## 2019-09-03 ENCOUNTER — Ambulatory Visit (INDEPENDENT_AMBULATORY_CARE_PROVIDER_SITE_OTHER): Payer: BLUE CROSS/BLUE SHIELD | Admitting: Physician Assistant

## 2019-09-03 DIAGNOSIS — R600 Localized edema: Secondary | ICD-10-CM | POA: Diagnosis not present

## 2019-09-03 DIAGNOSIS — H5789 Other specified disorders of eye and adnexa: Secondary | ICD-10-CM | POA: Diagnosis not present

## 2019-09-03 MED ORDER — FUROSEMIDE 20 MG PO TABS: 20.0000 mg | ORAL_TABLET | Freq: Every day | ORAL | 0 refills | Status: DC

## 2019-09-03 MED ORDER — OFLOXACIN 0.3 % OP SOLN: 1.0000 [drp] | Freq: Four times a day (QID) | OPHTHALMIC | 0 refills | Status: AC

## 2019-09-03 NOTE — Patient Instructions (Signed)

## 2019-09-03 NOTE — Progress Notes (Signed)
Established patient visit   Patient: Brett West   DOB: 09-Oct-1970   49 y.o. Male  MRN: [id number] Visit Date: 09/03/2019  Today's healthcare provider: Trinna Post, PA-C   Chief Complaint  Patient presents with  . Eye Problem    This visit type was conducted due to national recommendations for restrictions regarding the COVID-19 Pandemic (e.g. social distancing) in an effort to limit this patient's exposure and mitigate transmission in our community. Due to his co-morbid illnesses, this patient is at least at moderate risk for complications without adequate follow up. This format is felt to be most appropriate for this patient at this time. The patient did not have access to video technology or had technical difficulties with video requiring transitioning to audio format only (telephone). Physical exam was limited to content and character of the telephone converstion.    Patient location: Home  Provider location: Office  Subjective    Eye Problem  The right eye is affected. This is a new problem. The current episode started yesterday. The problem has been gradually worsening. There was no injury mechanism. There is no known exposure to pink eye. He does not wear contacts. Associated symptoms include an eye discharge (crusty discharge), eye redness and itching. Pertinent negatives include no fever, nausea or vomiting. He has tried nothing for the symptoms.   Denies photophobia, vision changes.   Lower Extremity Edema  Reports he takes Lasix 20 mg daily for this or otherwise his legs get weepy. He has been living in [place] for the past year.      Medications: Outpatient Medications Prior to Visit  Medication Sig  . EPINEPHrine (EPIPEN 2-PAK) 0.3 mg/0.3 mL IJ SOAJ injection Inject 0.3 mg once into the muscle.  . ibuprofen (ADVIL,MOTRIN) 200 MG tablet Take 400 mg every 6 (six) hours as needed by mouth for headache or moderate pain.  . meloxicam (MOBIC) 7.5 MG  tablet Take 7.5 mg 2 (two) times daily by mouth.  . montelukast (SINGULAIR) 10 MG tablet Take 1 tablet (10 mg total) by mouth at bedtime.  . phentermine 37.5 MG capsule TAKE 1 CAPSULE BY MOUTH IN THE MORNING AROUND 10 AM.  . [DISCONTINUED] furosemide (LASIX) 20 MG tablet Take 1 tablet by mouth once daily   No facility-administered medications prior to visit.    Review of Systems  Constitutional: Negative for appetite change, chills and fever.  Eyes: Positive for pain, discharge (crusty discharge), redness and itching.  Respiratory: Negative for chest tightness, shortness of breath and wheezing.   Cardiovascular: Negative for chest pain and palpitations.  Gastrointestinal: Negative for abdominal pain, nausea and vomiting.      Objective    There were no vitals taken for this visit.   Physical Exam    No results found for any visits on 09/03/19.  Assessment & Plan    1. Red eye  - ofloxacin (OCUFLOX) 0.3 % ophthalmic solution; Place 1 drop into the right eye 4 (four) times daily for 7 days.  Dispense: 1.4 mL; Refill: 0  2. Lower extremity edema  Refilled for 90 days until CPE. Advised he will need to follow up in three months.  - furosemide (LASIX) 20 MG tablet; Take 1 tablet (20 mg total) by mouth daily.  Dispense: 90 tablet; Refill: 0   I have spent 15 minutes of non face to face time with this patient, >50% of which was spent on counseling and coordination of care.  ITrey Sailors, PA-C, have reviewed all documentation for this visit. The documentation on 09/03/19 for the exam, diagnosis, procedures, and orders are all accurate and complete.    Maryella Shivers  [facility] [phone] (phone) [phone] (fax)  [facility]

## 2019-12-05 ENCOUNTER — Other Ambulatory Visit: Payer: Self-pay | Admitting: Family Medicine

## 2019-12-05 ENCOUNTER — Other Ambulatory Visit: Payer: Self-pay | Admitting: Physician Assistant

## 2019-12-05 DIAGNOSIS — R600 Localized edema: Secondary | ICD-10-CM

## 2019-12-05 NOTE — Telephone Encounter (Signed)
Attempted to call patient to schedule follow up- left message to call for appointment. 30 day courtesy RF given

## 2019-12-05 NOTE — Telephone Encounter (Signed)
Requested medication (s) are due for refill today:  Provider's decision  Requested medication (s) are on the active medication list:   Yes  Future visit scheduled:   No   Last ordered: Non delegated refill last refilled 04/28/2019 #30, RF 0  Non delegated refill   Requested Prescriptions  Pending Prescriptions Disp Refills   phentermine 37.5 MG capsule [Pharmacy Med Name: Phentermine HCl 37.5 MG Oral Capsule] 30 capsule 0    Sig: TAKE 1 CAPSULE BY MOUTH IN THE MORNING AROUND 10 AM.      Not Delegated - Gastroenterology:  Antiobesity Agents Failed - 12/05/2019  7:11 AM      Failed - This refill cannot be delegated      Failed - Valid encounter within last 12 months    Recent Outpatient Visits           3 months ago Red eye   [facility] Trey Sailors, [place]   1 year ago Frequent urination   [facility] Chrismon, Jodell Cipro, [place]   1 year ago Prediabetes   [facility] Malva Limes, [place]   2 years ago Annual physical exam   [facility] Chrismon, Jodell Cipro, [place]   2 years ago Dermatitis   [facility] Malva Limes, MD              Passed - Last BP in normal range    BP Readings from Last 1 Encounters:  05/17/18 124/70          Passed - Last Heart Rate in normal range    Pulse Readings from Last 1 Encounters:  05/17/18 73

## 2019-12-17 ENCOUNTER — Ambulatory Visit: Payer: 59 | Admitting: Podiatry

## 2020-04-28 ENCOUNTER — Other Ambulatory Visit: Payer: Self-pay | Admitting: Podiatry

## 2020-04-28 DIAGNOSIS — M76822 Posterior tibial tendinitis, left leg: Secondary | ICD-10-CM

## 2020-04-28 DIAGNOSIS — S90122A Contusion of left lesser toe(s) without damage to nail, initial encounter: Secondary | ICD-10-CM

## 2020-05-13 ENCOUNTER — Ambulatory Visit
Admission: RE | Admit: 2020-05-13 | Discharge: 2020-05-13 | Disposition: A | Discharge status: Home / Self Care | Payer: Worker's Compensation | Source: Ambulatory Visit | Attending: Podiatry | Admitting: Podiatry

## 2020-05-13 ENCOUNTER — Other Ambulatory Visit: Payer: Self-pay

## 2020-05-13 DIAGNOSIS — S90122A Contusion of left lesser toe(s) without damage to nail, initial encounter: Secondary | ICD-10-CM | POA: Diagnosis present

## 2020-05-13 DIAGNOSIS — M76822 Posterior tibial tendinitis, left leg: Secondary | ICD-10-CM | POA: Diagnosis present

## 2020-05-13 DIAGNOSIS — S9032XA Contusion of left foot, initial encounter: Secondary | ICD-10-CM | POA: Insufficient documentation

## 2020-06-10 ENCOUNTER — Ambulatory Visit (INDEPENDENT_AMBULATORY_CARE_PROVIDER_SITE_OTHER): Payer: BLUE CROSS/BLUE SHIELD | Admitting: Podiatry

## 2020-06-10 ENCOUNTER — Other Ambulatory Visit: Payer: Self-pay

## 2020-06-10 DIAGNOSIS — M76822 Posterior tibial tendinitis, left leg: Secondary | ICD-10-CM

## 2020-06-10 MED ORDER — DICLOFENAC SODIUM 75 MG PO TBEC
75.0000 mg | DELAYED_RELEASE_TABLET | Freq: Two times a day (BID) | ORAL | 2 refills | Status: DC
Start: 2020-06-10 — End: 2020-09-06

## 2020-06-10 NOTE — Progress Notes (Signed)
Subjective:   Patient ID: Brett West, male   DOB: 50 y.o.   MRN: [id number]   HPI Patient presents stating that he had an injury around 7 months ago and is developed a lot of swelling on the inside of the left ankle and has had physical therapy and previous boot usage and continues to be sore and has had several MRIs done   Review of Systems  All other systems reviewed and are negative.       Objective:  Physical Exam Vitals and nursing note reviewed.  Constitutional:      Appearance: He is well-developed and well-nourished.  Cardiovascular:     Pulses: Intact distal pulses.  Pulmonary:     Effort: Pulmonary effort is normal.  Musculoskeletal:        General: Normal range of motion.  Skin:    General: Skin is warm.  Neurological:     Mental Status: He is alert.     Neurovascular status intact with inflammation pain of the posterior tibial complex as it comes underneath the medial malleolus with no indication of tendon dysfunction when tested     Assessment:  Posterior tibial tendinitis left with obesity is complicating factor with injury which occurred in July which may have created the symptoms that he is experiencing     Plan:  H&P reviewed condition and MRI indicating inflammation with the possibility of subtle tear.  Today I went ahead and I did do a sheath injection in the area of intense discomfort 3 mg Dexasone Kenalog 5 mg Xylocaine and then went ahead and I want him to wear his air fracture walker and if not improved in the next month we will need to consider surgical intervention

## 2020-09-06 ENCOUNTER — Other Ambulatory Visit: Payer: Self-pay | Admitting: Podiatry

## 2020-09-06 NOTE — Telephone Encounter (Signed)
Please Advise

## 2020-09-09 DIAGNOSIS — S93429A Sprain of deltoid ligament of unspecified ankle, initial encounter: Secondary | ICD-10-CM | POA: Insufficient documentation

## 2020-09-09 DIAGNOSIS — M2142 Flat foot [pes planus] (acquired), left foot: Secondary | ICD-10-CM | POA: Insufficient documentation

## 2020-09-09 DIAGNOSIS — M79673 Pain in unspecified foot: Secondary | ICD-10-CM | POA: Insufficient documentation

## 2020-09-09 DIAGNOSIS — M76829 Posterior tibial tendinitis, unspecified leg: Secondary | ICD-10-CM | POA: Insufficient documentation

## 2020-09-09 DIAGNOSIS — S93602A Unspecified sprain of left foot, initial encounter: Secondary | ICD-10-CM | POA: Insufficient documentation

## 2020-12-28 DIAGNOSIS — E119 Type 2 diabetes mellitus without complications: Secondary | ICD-10-CM | POA: Insufficient documentation

## 2021-05-01 ENCOUNTER — Other Ambulatory Visit: Payer: Self-pay | Admitting: Podiatry

## 2021-08-04 ENCOUNTER — Other Ambulatory Visit: Payer: Self-pay | Admitting: Podiatry

## 2021-08-04 NOTE — Telephone Encounter (Signed)
Please advise 

## 2021-08-30 ENCOUNTER — Encounter (INDEPENDENT_AMBULATORY_CARE_PROVIDER_SITE_OTHER): Payer: BLUE CROSS/BLUE SHIELD | Admitting: Vascular Surgery

## 2021-08-30 ENCOUNTER — Encounter (INDEPENDENT_AMBULATORY_CARE_PROVIDER_SITE_OTHER): Payer: Self-pay

## 2021-09-05 ENCOUNTER — Encounter (INDEPENDENT_AMBULATORY_CARE_PROVIDER_SITE_OTHER): Payer: Self-pay | Admitting: Nurse Practitioner

## 2021-09-05 ENCOUNTER — Ambulatory Visit (INDEPENDENT_AMBULATORY_CARE_PROVIDER_SITE_OTHER): Payer: BLUE CROSS/BLUE SHIELD | Admitting: Nurse Practitioner

## 2021-09-05 VITALS — BP 126/79 | HR 61 | Resp 16 | Ht 75.0 in | Wt 282.6 lb

## 2021-09-05 DIAGNOSIS — M7989 Other specified soft tissue disorders: Secondary | ICD-10-CM | POA: Diagnosis not present

## 2021-09-17 ENCOUNTER — Encounter (INDEPENDENT_AMBULATORY_CARE_PROVIDER_SITE_OTHER): Payer: Self-pay | Admitting: Nurse Practitioner

## 2021-09-17 NOTE — Progress Notes (Signed)
Subjective:    Patient ID: Brett West, male    DOB: 1971-04-26, 51 y.o.   MRN: [id number] Chief Complaint  Patient presents with   New Patient (Initial Visit)    Ref consult chronic le edema    The patient is a 51 year old male that presents today for evaluation of lower extremity edema.  About a year the patient had recurrent episodes of swelling and leaking in the left lower extremity.  He notes that the swelling has always been worse in the left leg than the right.  He denies any tenderness.  He notes that since he lost approximately 70 pounds last year the swelling has been drastically improved.  He denies any open wounds or ulcerations currently.  He denies any claudication-like symptoms or rest pain.   Review of Systems  All other systems reviewed and are negative.     Objective:   Physical Exam Vitals reviewed.  HENT:     Head: Normocephalic.  Cardiovascular:     Rate and Rhythm: Normal rate.  Pulmonary:     Effort: Pulmonary effort is normal.  Musculoskeletal:     Right lower leg: 1+ Edema present.     Left lower leg: 1+ Edema present.  Skin:    General: Skin is warm and dry.  Neurological:     Mental Status: He is alert and oriented to person, place, and time.  Psychiatric:        Mood and Affect: Mood normal.        Behavior: Behavior normal.        Thought Content: Thought content normal.        Judgment: Judgment normal.    BP 126/79 (BP Location: Right Arm)   Pulse 61   Resp 16   Ht 6\' 3"  (1.905 m)   Wt 282 lb 9.6 oz (128.2 kg)   BMI 35.32 kg/m   Past Medical History:  Diagnosis Date   Arthritis    RIGHT KNEE   History of methicillin resistant staphylococcus aureus (MRSA) 2005   Sleep apnea    USES CPAP    Social History   Socioeconomic History   Marital status: Married    Spouse name: Not on file   Number of children: 2   Years of education: Not on file   Highest education level: Not on file  Occupational History   Occupation:  [employer]  Tobacco Use   Smoking status: Former    Packs/day: 0.50    Years: 10.00    Pack years: 5.00    Types: Cigarettes    Quit date: 04/30/2004    Years since quitting: 17.3   Smokeless tobacco: Never  Vaping Use   Vaping Use: Never used  Substance and Sexual Activity   Alcohol use: No   Drug use: No   Sexual activity: Not on file  Other Topics Concern   Not on file  Social History Narrative   Works for 05/02/2004 of [employer]   Social Determinants of Health   Financial Resource Strain: Not on file  Food Insecurity: Not on file  Transportation Needs: Not on file  Physical Activity: Not on file  Stress: Not on file  Social Connections: Not on file  Intimate Partner Violence: Not on file    Past Surgical History:  Procedure Laterality Date   KNEE ARTHROSCOPY Right 04/05/2017   Procedure: ARTHROSCOPY RIGHT KNEE, PARTIAL MEDIAL MENISECTOMY;  Surgeon: 14/09/2016, MD;  Location: [facility] ORS;  Service:  Orthopedics;  Laterality: Right;   PLANTAR FASCIECTOMY Right 04/2015   S/P foot surgery  12/2002   Sebasceous cyst  03/27/2007   Dr. Michela Pitcher   VASECTOMY N/A 02/14/2016   Procedure: VASECTOMY;  Surgeon: Vanna Scotland, MD;  Location: [facility] ORS;  Service: Urology;  Laterality: N/A;    Family History  Problem Relation Age of Onset   Arthritis Father    Kidney disease Neg Hx    Prostate cancer Neg Hx     Allergies  Allergen Reactions   Bee Venom Swelling    Difficulty breathing       Latest Ref Rng & Units 05/17/2018    9:20 AM 10/31/2017    9:32 AM 04/05/2017    6:29 AM  CBC  WBC 3.4 - 10.8 x10E3/uL 7.3   6.2   7.4    Hemoglobin 13.0 - 17.7 g/dL 75.6   43.3   29.5    Hematocrit 37.5 - 51.0 % 48.1   44.5   44.8    Platelets 150 - 450 x10E3/uL 249   226   200        CMP     Component Value Date/Time   NA 140 05/17/2018 0920   K 5.0 05/17/2018 0920   CL 101 05/17/2018 0920   CO2 24 05/17/2018 0920   GLUCOSE 100 (H) 05/17/2018 0920   GLUCOSE 117  (H) 04/05/2017 0629   BUN 24 05/17/2018 0920   CREATININE 1.35 (H) 05/17/2018 0920   CALCIUM 9.6 05/17/2018 0920   PROT 7.0 05/17/2018 0920   ALBUMIN 4.5 05/17/2018 0920   AST 20 05/17/2018 0920   ALT 27 05/17/2018 0920   ALKPHOS 85 05/17/2018 0920   BILITOT 0.3 05/17/2018 0920   GFRNONAA 62 05/17/2018 0920   GFRAA 72 05/17/2018 0920     No results found.     Assessment & Plan:   1. Leg swelling Recommend:  I have had a long discussion with the patient regarding swelling and why it  causes symptoms.  Patient will begin wearing graduated compression on a daily basis a prescription was given. The patient will  wear the stockings first thing in the morning and removing them in the evening. The patient is instructed specifically not to sleep in the stockings.   In addition, behavioral modification will be initiated.  This will include frequent elevation, use of over the counter pain medications and exercise such as walking.  Consideration for a lymph pump will also be made based upon the effectiveness of conservative therapy.  This would help to improve the edema control and prevent sequela such as ulcers and infections   Patient should undergo duplex ultrasound of the venous system to ensure that DVT or reflux is not present.  The patient will follow-up with me after the ultrasound.     Current Outpatient Medications on File Prior to Visit  Medication Sig Dispense Refill   diclofenac (VOLTAREN) 75 MG EC tablet TAKE 1 TABLET BY MOUTH TWICE A DAY 50 tablet 2   EPINEPHrine 0.3 mg/0.3 mL IJ SOAJ injection Inject 0.3 mg once into the muscle.     furosemide (LASIX) 20 MG tablet Take 1 tablet by mouth once daily 30 tablet 0   ibuprofen (ADVIL,MOTRIN) 200 MG tablet Take 400 mg every 6 (six) hours as needed by mouth for headache or moderate pain.     meloxicam (MOBIC) 7.5 MG tablet Take 7.5 mg 2 (two) times daily by mouth.     montelukast (SINGULAIR) 10  MG tablet Take 1 tablet (10 mg  total) by mouth at bedtime. 30 tablet 5   phentermine 37.5 MG capsule TAKE 1 CAPSULE BY MOUTH IN THE MORNING AROUND 10 AM. 30 capsule 0   No current facility-administered medications on file prior to visit.    There are no Patient Instructions on file for this visit. No follow-ups on file.   [place] SpinnerFallon E Bion Todorov, NP

## 2021-10-07 ENCOUNTER — Other Ambulatory Visit (INDEPENDENT_AMBULATORY_CARE_PROVIDER_SITE_OTHER): Payer: Self-pay | Admitting: Nurse Practitioner

## 2021-10-07 DIAGNOSIS — M7989 Other specified soft tissue disorders: Secondary | ICD-10-CM

## 2021-10-10 ENCOUNTER — Encounter (INDEPENDENT_AMBULATORY_CARE_PROVIDER_SITE_OTHER): Payer: Self-pay | Admitting: Vascular Surgery

## 2021-10-10 ENCOUNTER — Ambulatory Visit (INDEPENDENT_AMBULATORY_CARE_PROVIDER_SITE_OTHER): Payer: BLUE CROSS/BLUE SHIELD

## 2021-10-10 ENCOUNTER — Ambulatory Visit (INDEPENDENT_AMBULATORY_CARE_PROVIDER_SITE_OTHER): Payer: BLUE CROSS/BLUE SHIELD | Admitting: Vascular Surgery

## 2021-10-10 VITALS — BP 116/71 | HR 72 | Resp 16 | Wt 280.2 lb

## 2021-10-10 DIAGNOSIS — I8310 Varicose veins of unspecified lower extremity with inflammation: Secondary | ICD-10-CM

## 2021-10-10 DIAGNOSIS — M1991 Primary osteoarthritis, unspecified site: Secondary | ICD-10-CM | POA: Diagnosis not present

## 2021-10-10 DIAGNOSIS — M7989 Other specified soft tissue disorders: Secondary | ICD-10-CM | POA: Diagnosis not present

## 2021-10-10 DIAGNOSIS — I890 Lymphedema, not elsewhere classified: Secondary | ICD-10-CM | POA: Diagnosis not present

## 2021-10-10 DIAGNOSIS — E139 Other specified diabetes mellitus without complications: Secondary | ICD-10-CM | POA: Diagnosis not present

## 2021-10-12 ENCOUNTER — Telehealth (INDEPENDENT_AMBULATORY_CARE_PROVIDER_SITE_OTHER): Payer: Self-pay | Admitting: Vascular Surgery

## 2021-10-12 ENCOUNTER — Encounter (INDEPENDENT_AMBULATORY_CARE_PROVIDER_SITE_OTHER): Payer: Self-pay | Admitting: Vascular Surgery

## 2021-10-12 DIAGNOSIS — I890 Lymphedema, not elsewhere classified: Secondary | ICD-10-CM | POA: Insufficient documentation

## 2021-10-12 DIAGNOSIS — I8310 Varicose veins of unspecified lower extremity with inflammation: Secondary | ICD-10-CM | POA: Insufficient documentation

## 2021-10-12 NOTE — Telephone Encounter (Signed)
Spoke with pt regarding authorization for the laser ablation Dr. Gilda Crease wanted him to have. I asked pt if he knew he was out of network with [place] and he stated no. He states that his provider is the one that set the appt up because "we were in the same plan". I explained that his BCBS is showing [facility] and is showing in our system as Biomedical scientist. Pt asked if he had to see a new doctor and I replied that he could call his insurance and see how much (if any) they would pay. He thanked me and call was disconnected.

## 2021-10-12 NOTE — Progress Notes (Signed)
MRN : [id number]  Brett West is a 51 y.o. (1970-10-17) male who presents with chief complaint of varicose veins hurt.  History of Present Illness:  The patient returns for followup evaluation after the initial visit. The patient continues to have pain in the lower extremities with dependency. The pain is lessened with elevation. Graduated compression stockings, Class I (20-30 mmHg), have been worn but the stockings do not eliminate the leg pain. Over-the-counter analgesics do not improve the symptoms. The degree of discomfort continues to interfere with daily activities. The patient notes the pain in the legs is causing problems with daily exercise, at the workplace and even with household activities and maintenance such as standing in the kitchen preparing meals and doing dishes.   He is also noting significant lymphedema that is not well controled with compression, elevation and exercise.  Venous ultrasound shows normal deep venous system, no evidence of acute or chronic DVT.  Superficial reflux is present in the left great saphenous vein   Current Meds  Medication Sig   diclofenac (VOLTAREN) 75 MG EC tablet TAKE 1 TABLET BY MOUTH TWICE A DAY   EPINEPHrine 0.3 mg/0.3 mL IJ SOAJ injection Inject 0.3 mg once into the muscle.   furosemide (LASIX) 20 MG tablet Take 1 tablet by mouth once daily   ibuprofen (ADVIL,MOTRIN) 200 MG tablet Take 400 mg every 6 (six) hours as needed by mouth for headache or moderate pain.   meloxicam (MOBIC) 7.5 MG tablet Take 7.5 mg 2 (two) times daily by mouth.   montelukast (SINGULAIR) 10 MG tablet Take 1 tablet (10 mg total) by mouth at bedtime.   phentermine 37.5 MG capsule TAKE 1 CAPSULE BY MOUTH IN THE MORNING AROUND 10 AM.    Past Medical History:  Diagnosis Date   Arthritis    RIGHT KNEE   History of methicillin resistant staphylococcus aureus (MRSA) 2005   Sleep apnea    USES CPAP    Past Surgical History:  Procedure Laterality Date    KNEE ARTHROSCOPY Right 04/05/2017   Procedure: ARTHROSCOPY RIGHT KNEE, PARTIAL MEDIAL MENISECTOMY;  Surgeon: Thornton Park, MD;  Location: [facility];  Service: Orthopedics;  Laterality: Right;   PLANTAR FASCIECTOMY Right 04/2015   S/P foot surgery  12/2002   Sebasceous cyst  03/27/2007   Dr. Pat Patrick   VASECTOMY N/A 02/14/2016   Procedure: VASECTOMY;  Surgeon: Hollice Espy, MD;  Location: [facility];  Service: Urology;  Laterality: N/A;    Social History Social History   Tobacco Use   Smoking status: Former    Packs/day: 0.50    Years: 10.00    Total pack years: 5.00    Types: Cigarettes    Quit date: 04/30/2004    Years since quitting: 17.4   Smokeless tobacco: Never  Vaping Use   Vaping Use: Never used  Substance Use Topics   Alcohol use: No   Drug use: No    Family History Family History  Problem Relation Age of Onset   Arthritis Father    Kidney disease Neg Hx    Prostate cancer Neg Hx     Allergies  Allergen Reactions   Bee Venom Swelling    Difficulty breathing     REVIEW OF SYSTEMS (Negative unless checked)  Constitutional: [] Weight loss  [] Fever  [] Chills Cardiac: [] Chest pain   [] Chest pressure   [] Palpitations   [] Shortness of breath when laying flat   [] Shortness of breath with exertion. Vascular:  [] Pain in  legs with walking   [x] Pain in legs with standing  [] History of DVT   [] Phlebitis   [] Swelling in legs   [x] Varicose veins   [] Non-healing ulcers Pulmonary:   [] Uses home oxygen   [] Productive cough   [] Hemoptysis   [] Wheeze  [] COPD   [] Asthma Neurologic:  [] Dizziness   [] Seizures   [] History of stroke   [] History of TIA  [] Aphasia   [] Vissual changes   [] Weakness or numbness in arm   [] Weakness or numbness in leg Musculoskeletal:   [] Joint swelling   [] Joint pain   [] Low back pain Hematologic:  [] Easy bruising  [] Easy bleeding   [] Hypercoagulable state   [] Anemic Gastrointestinal:  [] Diarrhea   [] Vomiting  [] Gastroesophageal reflux/heartburn    [] Difficulty swallowing. Genitourinary:  [] Chronic kidney disease   [] Difficult urination  [] Frequent urination   [] Blood in urine Skin:  [] Rashes   [] Ulcers  Psychological:  [] History of anxiety   []  History of major depression.  Physical Examination  Vitals:   10/10/21 1530  BP: 116/71  Pulse: 72  Resp: 16  Weight: 280 lb 3.2 oz (127.1 kg)   Body mass index is 35.02 kg/m. Gen: WD/WN, NAD Head: [place]/AT, No temporalis wasting.  Ear/Nose/Throat: Hearing grossly intact, nares w/o erythema or drainage, pinna without lesions Eyes: PER, EOMI, sclera nonicteric.  Neck: Supple, no gross masses.  No JVD.  Pulmonary:  Good air movement, no audible wheezing, no use of accessory muscles.  Cardiac: RRR, precordium not hyperdynamic. Vascular:  Large varicosities present, greater than 10 mm left.  Veins are tender to palpation  Moderate venous stasis changes to the legs bilaterally.  3+ soft pitting edema  Vessel Right Left  Radial Palpable Palpable  Gastrointestinal: soft, non-distended. No guarding/no peritoneal signs.  Musculoskeletal: M/S 5/5 throughout.  No deformity.  Neurologic: CN 2-12 intact. Pain and light touch intact in extremities.  Symmetrical.  Speech is fluent. Motor exam as listed above. Psychiatric: Judgment intact, Mood & affect appropriate for pt's clinical situation. Dermatologic: Venous rashes no ulcers noted.  No changes consistent with cellulitis. Lymph : No lichenification or skin changes of chronic lymphedema.  CBC Lab Results  Component Value Date   WBC 7.3 05/17/2018   HGB 15.8 05/17/2018   HCT 48.1 05/17/2018   MCV 83 05/17/2018   PLT 249 05/17/2018    BMET    Component Value Date/Time   NA 140 05/17/2018 0920   K 5.0 05/17/2018 0920   CL 101 05/17/2018 0920   CO2 24 05/17/2018 0920   GLUCOSE 100 (H) 05/17/2018 0920   GLUCOSE 117 (H) 04/05/2017 0629   BUN 24 05/17/2018 0920   CREATININE 1.35 (H) 05/17/2018 0920   CALCIUM 9.6 05/17/2018 0920    GFRNONAA 62 05/17/2018 0920   GFRAA 72 05/17/2018 0920   CrCl cannot be calculated (Patient's most recent lab result is older than the maximum 21 days allowed.).  COAG Lab Results  Component Value Date   INR 1.00 04/05/2017    Radiology VAS [place] LOWER EXTREMITY VENOUS REFLUX  Result Date: 10/10/2021  Lower Venous Reflux Study Patient Name:  LOUISE MCCULLAGH  Date of Exam:   10/10/2021 Medical Rec #: [id number]           Accession #:    [id number] Date of Birth: 26-Jul-1970            Patient Gender: M Patient Age:   42 years Exam Location:  [facility] Procedure:      VAS [place]  LOWER EXTREMITY VENOUS REFLUX Referring Phys: Eulogio Ditch --------------------------------------------------------------------------------  Indications: Edema, Swelling, and Lt > Rt.  Performing Technologist: Concha Norway RVT  Examination Guidelines: A complete evaluation includes B-mode imaging, spectral Doppler, color Doppler, and power Doppler as needed of all accessible portions of each vessel. Bilateral testing is considered an integral part of a complete examination. Limited examinations for reoccurring indications may be performed as noted. The reflux portion of the exam is performed with the patient in reverse Trendelenburg. Significant venous reflux is defined as >500 ms in the superficial venous system, and >1 second in the deep venous system.  +--------------+---------+------+-----------+------------+--------+ LEFT          Reflux NoRefluxReflux TimeDiameter cmsComments                         Yes                                  +--------------+---------+------+-----------+------------+--------+ CFV                     yes     475ms                        +--------------+---------+------+-----------+------------+--------+ FV mid                  yes      550                         +--------------+---------+------+-----------+------------+--------+ GSV at SFJ              yes     >500 ms      .76              +--------------+---------+------+-----------+------------+--------+ GSV prox thigh          yes    >500 ms      .76              +--------------+---------+------+-----------+------------+--------+ GSV mid thigh           yes    >500 ms      .68              +--------------+---------+------+-----------+------------+--------+ GSV dist thighno                            .43              +--------------+---------+------+-----------+------------+--------+ GSV at knee             yes    >500 ms      .54              +--------------+---------+------+-----------+------------+--------+ GSV prox calf           yes    >500 ms      .52              +--------------+---------+------+-----------+------------+--------+   Summary: Right: - No evidence of deep vein thrombosis seen in the right lower extremity, from the common femoral through the popliteal veins. - No evidence of superficial venous thrombosis in the right lower extremity. - There is no evidence of venous reflux seen in the right lower extremity. - No evidence of superficial venous reflux seen in the right greater saphenous vein. - No evidence of superficial venous reflux seen in  the right short saphenous vein.  Left: - No evidence of deep vein thrombosis seen in the left lower extremity, from the common femoral through the popliteal veins. - No evidence of superficial venous thrombosis in the left lower extremity. - No evidence of superficial venous reflux seen in the left short saphenous vein. - Venous reflux is noted in the left sapheno-femoral junction. - Venous reflux is noted in the left greater saphenous vein in the thigh. - Venous reflux is noted in the left greater saphenous vein in the calf.  *See table(s) above for measurements and observations. Electronically signed by Hortencia Pilar MD on 10/10/2021 at 5:23:47 PM.    Final      Assessment/Plan 1. Varicose veins with  inflammation Recommend  I have reviewed my previous  discussion with the patient regarding  varicose veins and why they cause symptoms. Patient will continue  wearing graduated compression stockings class 1 on a daily basis, beginning first thing in the morning and removing them in the evening.    In addition, behavioral modification including elevation during the day was again discussed and this will continue.  The patient has utilized over the counter pain medications and has been exercising.  However, at this time conservative therapy has not alleviated the patient's symptoms of leg pain and swelling  Recommend: laser ablation of the left great saphenous vein to eliminate the symptoms of pain and swelling of the lower extremities caused by the severe superficial venous reflux disease.    2. Lymphedema Recommend:  No surgery or intervention at this point in time.    I have reviewed my discussion with the patient regarding lymphedema and why it  causes symptoms.  Patient will continue wearing graduated compression on a daily basis. The patient should put the compression on first thing in the morning and removing them in the evening. The patient should not sleep in the compression.   In addition, behavioral modification throughout the day will be continued.  This will include frequent elevation (such as in a recliner), use of over the counter pain medications as needed and exercise such as walking.  The systemic causes for chronic edema such as liver, kidney and cardiac etiologies do not appear to have significant changed over the past year.    Despite conservative treatments including graduated compression therapy class 1 and behavioral modification including exercise and elevation the patient  has not obtained adequate control of the lymphedema.  The patient still has stage 3 lymphedema and therefore, I believe that a lymph pump should be added to improve the control of the patient's  lymphedema.  Additionally, a lymph pump is warranted because it will reduce the risk of cellulitis and ulceration in the future.  Patient should follow-up in six months    3. Diabetes mellitus of other type without complication, unspecified whether long term insulin use ([place]) Continue hypoglycemic medications as already ordered, these medications have been reviewed and there are no changes at this time.  Hgb A1C to be monitored as already arranged by primary service   4. Primary osteoarthritis, unspecified site Continue NSAID medications as already ordered, these medications have been reviewed and there are no changes at this time.  Continued activity and therapy was stressed.     Hortencia Pilar, MD  10/12/2021 1:24 PM

## 2021-10-18 ENCOUNTER — Telehealth (INDEPENDENT_AMBULATORY_CARE_PROVIDER_SITE_OTHER): Payer: Self-pay | Admitting: Nurse Practitioner

## 2021-10-18 ENCOUNTER — Other Ambulatory Visit (INDEPENDENT_AMBULATORY_CARE_PROVIDER_SITE_OTHER): Payer: Self-pay | Admitting: Nurse Practitioner

## 2021-10-18 DIAGNOSIS — I8310 Varicose veins of unspecified lower extremity with inflammation: Secondary | ICD-10-CM

## 2021-10-18 NOTE — Telephone Encounter (Signed)
Please send referral to [facility]... external referral placed

## 2021-10-18 NOTE — Telephone Encounter (Signed)
This patient is not in network with [place] and is needing an referral sent to any [facility] vascular office... ASAP    Please advise

## 2021-10-19 NOTE — Telephone Encounter (Signed)
LVM for pt Brett West with the name of Vascular practice that he would like his referral sent to that is in network with [facility]. Pt is out of network with [facility] and needs to see Vascular within [facility].

## 2021-10-25 ENCOUNTER — Telehealth (INDEPENDENT_AMBULATORY_CARE_PROVIDER_SITE_OTHER): Payer: Self-pay | Admitting: Nurse Practitioner

## 2021-11-19 ENCOUNTER — Other Ambulatory Visit: Payer: Self-pay | Admitting: Podiatry

## 2021-11-24 ENCOUNTER — Other Ambulatory Visit: Payer: Self-pay | Admitting: Podiatry

## 2021-11-24 MED ORDER — DICLOFENAC SODIUM 75 MG PO TBEC: 75.0000 mg | DELAYED_RELEASE_TABLET | Freq: Two times a day (BID) | ORAL | 2 refills | Status: AC

## 2021-11-24 NOTE — Telephone Encounter (Signed)
I sent in refill

## 2021-11-24 NOTE — Telephone Encounter (Signed)
He should probably make appointment

## 2021-11-24 NOTE — Progress Notes (Signed)
vol

## 2021-11-24 NOTE — Telephone Encounter (Signed)
Called pt and the previous prescription is showing 2 refills but the pharmacy is telling him there was no more.  Pt is in the process of changing insurances in a few months as he is out of network with our office currently and is going to make sure we are in network when he changes. He is wanting to wait until the new insurance is effective to make an appt.

## 2022-04-20 ENCOUNTER — Ambulatory Visit: Payer: BLUE CROSS/BLUE SHIELD | Admitting: Dermatology

## 2022-04-21 IMAGING — MR MR ANKLE*L* W/O CM
5 series · 40 of 40 positions shown · non-contrast
Comparison: Radiographs of 10/10/2013

CLINICAL DATA: Twisting ankle injury in October 2019. Medial ankle
pain and swelling.

EXAM:
MRI OF THE LEFT ANKLE WITHOUT CONTRAST
TECHNIQUE: Multiplanar, multisequence MR imaging of the ankle was performed. No
intravenous contrast was administered.

[Series 4: PD fat-sat · axial · left · 3.0mm · 0.50mm/px · z∈[-111,+28]mm · 10 of 36 slices shown]
[im 1/36]
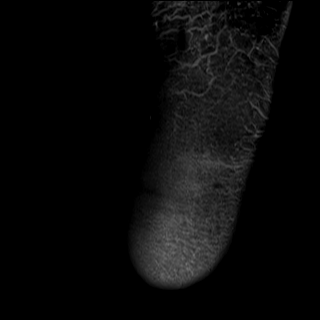
[im 4/36]
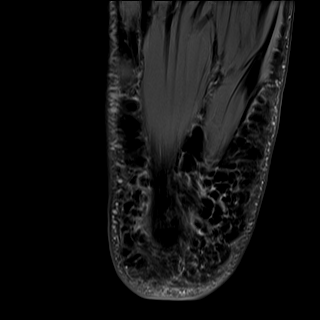
[im 8/36]
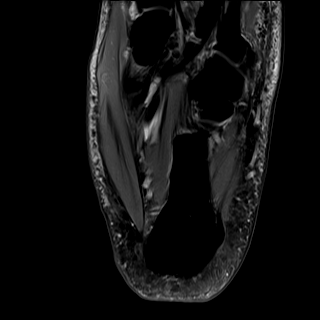
[im 12/36]
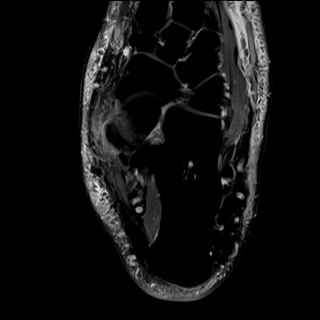
[im 16/36]
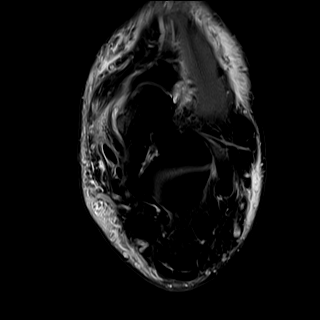
[im 20/36]
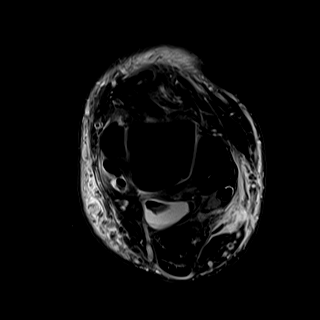
[im 24/36]
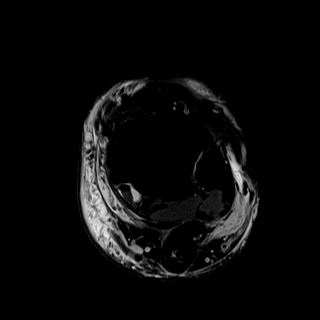
[im 28/36]
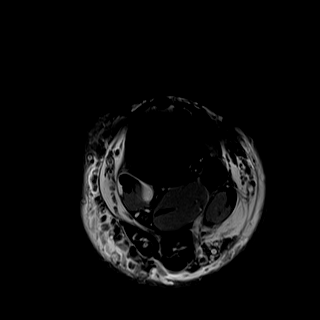
[im 32/36]
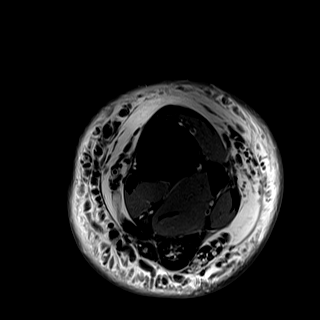
[im 36/36]
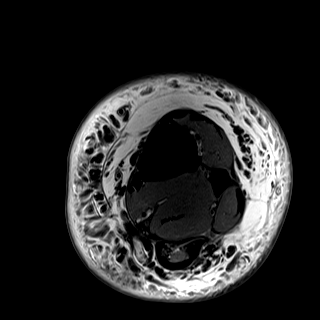

[Series 5: T2 fat-sat · axial · left · 3.0mm · 0.50mm/px · z∈[-111,+28]mm · 10 of 36 slices shown]
[im 1/36]
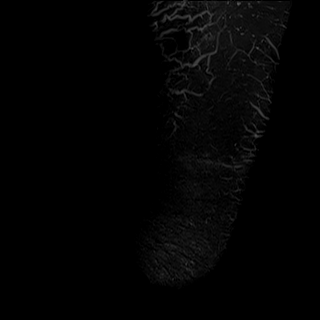
[im 4/36]
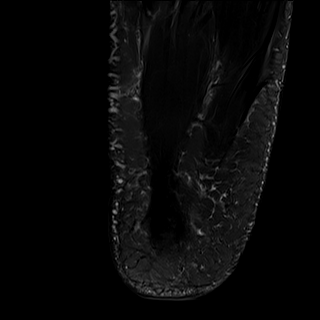
[im 8/36]
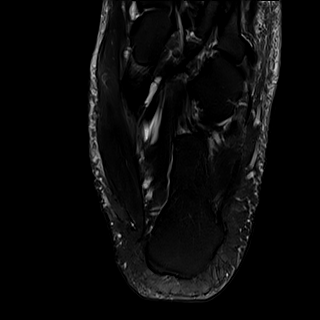
[im 12/36]
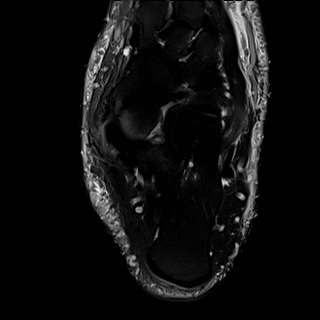
[im 16/36]
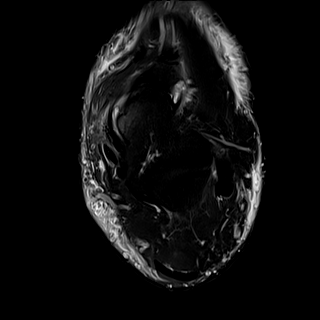
[im 20/36]
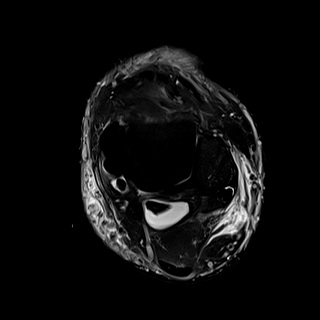
[im 24/36]
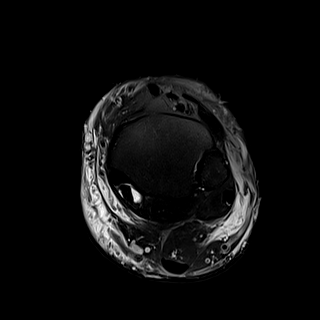
[im 28/36]
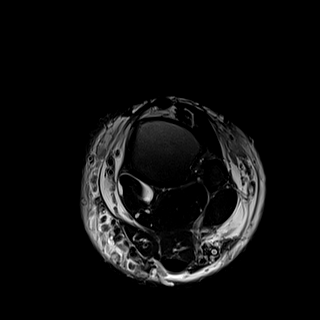
[im 32/36]
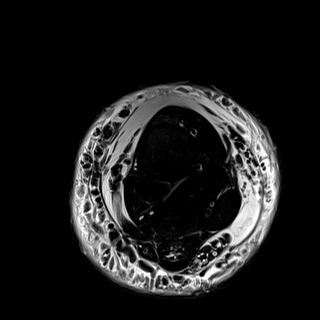
[im 36/36]
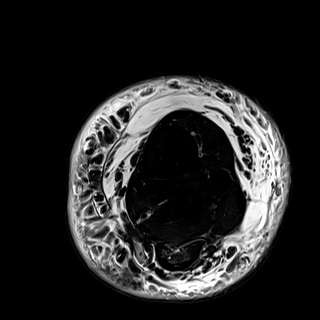

[Series 8: T1 · sagittal · left · 4.0mm · 0.70mm/px · 5 of 21 slices shown]
[im 1/21]
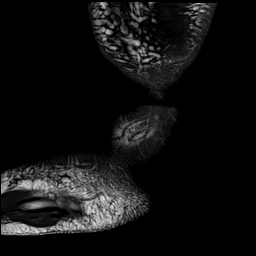
[im 6/21]
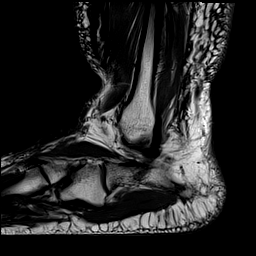
[im 11/21]
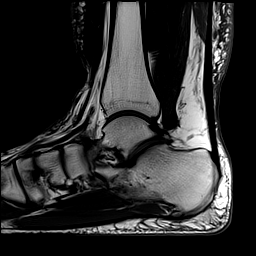
[im 16/21]
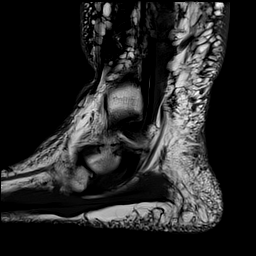
[im 21/21]
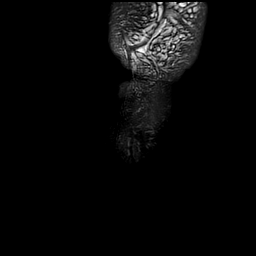

[Series 9: STIR · sagittal · left · 4.0mm · 0.35mm/px · 5 of 21 slices shown]
[im 1/21]
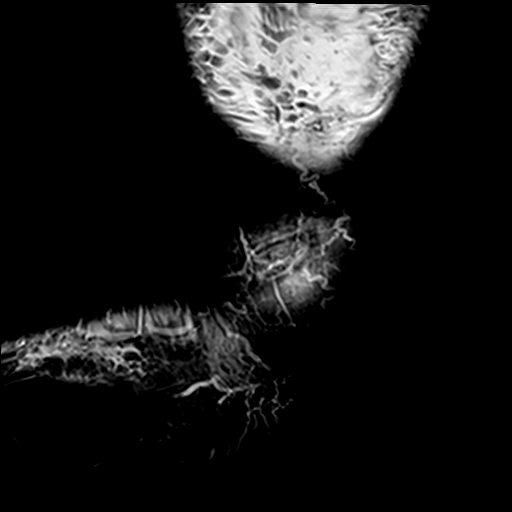
[im 6/21]
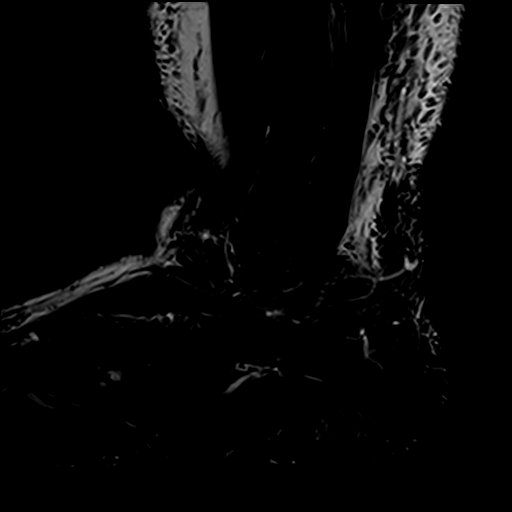
[im 11/21]
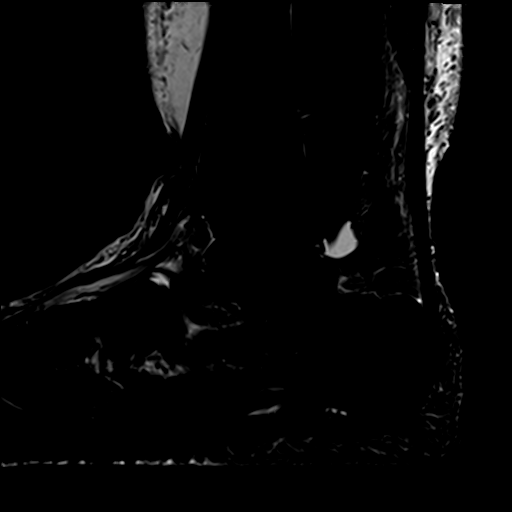
[im 16/21]
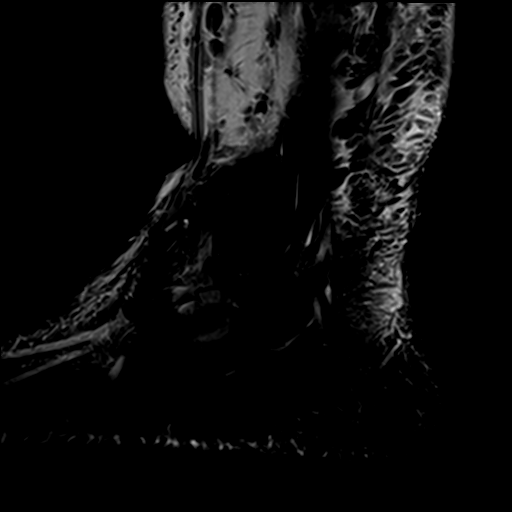
[im 21/21]
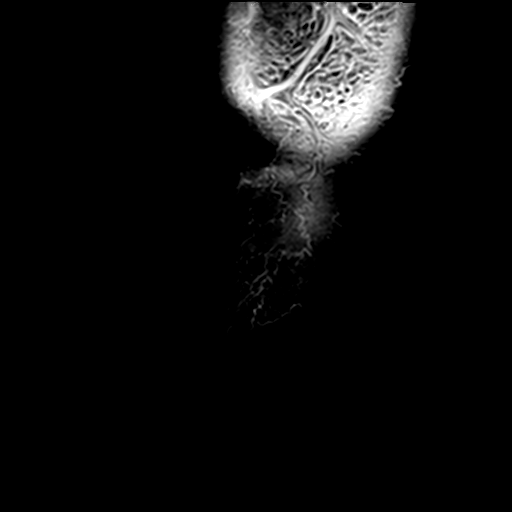

[Series 10: T2 · coronal · left · 3.0mm · 0.62mm/px · 10 of 40 slices shown]
[im 1/40]
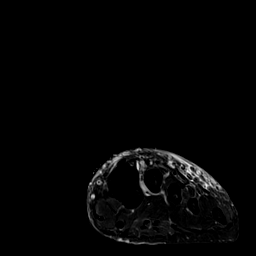
[im 5/40]
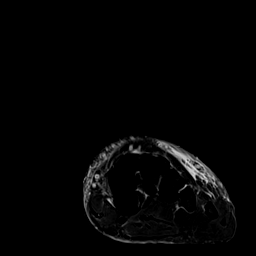
[im 9/40]
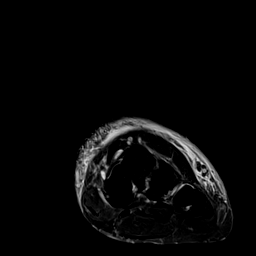
[im 14/40]
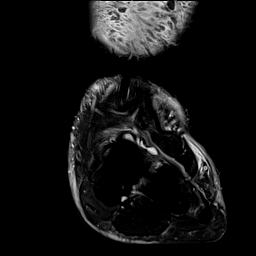
[im 18/40]
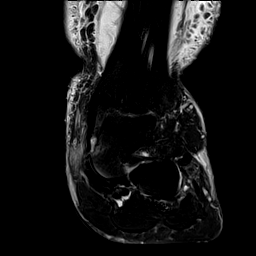
[im 22/40]
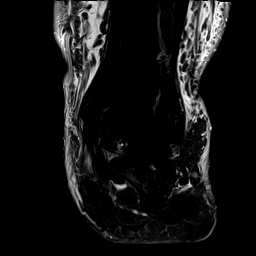
[im 27/40]
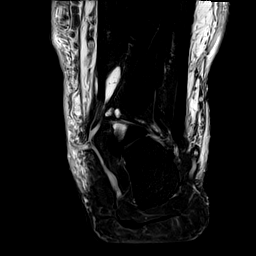
[im 31/40]
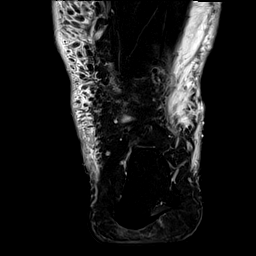
[im 35/40]
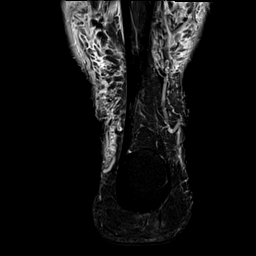
[im 40/40]
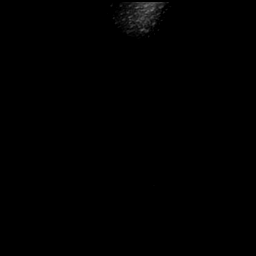

[40 of 40 positions shown; findings below may reference images not displayed]

FINDINGS: TENDONS

Peroneal: Mild common peroneal and peroneus longus tenosynovitis.

Posteromedial: Mild distal tibialis posterior tendinopathy. Mild
tibialis posterior, flexor digitorum longus, and flexor hallucis
longus tenosynovitis.

Anterior: Unremarkable

Achilles: Unremarkable

Plantar Fascia: Unremarkable.  Incidental plantar calcaneal spur.

LIGAMENTS

Lateral: Unremarkable

Medial: Thickened and indistinct superomedial portion of the spring
ligament with some low-level edema in the underlying medial head of
the talus as shown on image 21 of series 5. Mild thickening of the
medioplantar oblique portion of the spring ligament without overt
discontinuity. The deltoid ligament appears intact.

CARTILAGE

Ankle Joint: Unremarkable

Subtalar Joints/Sinus Tarsi: Degenerative findings in the posterior
subtalar facet laterally with subcortical marrow edema shown on
image 13 of series 9.

Bones: Mild degenerative spurring in the midfoot especially
dorsally.

Other: Small dorsal effusion of the talonavicular joint.
Considerable subcutaneous edema in the distal calf, ankle, and
tracking in the dorsum of the foot.
IMPRESSION: 1. Thickened and indistinct superomedial portion of the spring
ligament with some low-level edema in the underlying medial head of
the talus.
2. Mild thickening of the medioplantar oblique portion of the spring
ligament without overt discontinuity.
3. Distal tibialis posterior tendinopathy, correlate clinically in
assessing for tibialis posterior dysfunction.
4. Mild tibialis posterior, flexor digitorum longus, and flexor
hallucis longus tenosynovitis. Mild common peroneus tenosynovitis.
5. Degenerative findings in the posterior subtalar facet laterally
with subcortical marrow edema. Small dorsal effusion of the
talonavicular joint.
6. Mild degenerative spurring in the midfoot especially dorsally.
7. Considerable subcutaneous edema in the distal calf, ankle, and
tracking in the dorsum of the foot.

## 2022-06-08 ENCOUNTER — Other Ambulatory Visit: Payer: Self-pay | Admitting: Podiatry

## 2022-06-22 ENCOUNTER — Other Ambulatory Visit: Payer: Self-pay | Admitting: Podiatry

## 2022-10-09 DIAGNOSIS — I890 Lymphedema, not elsewhere classified: Secondary | ICD-10-CM | POA: Diagnosis not present

## 2022-10-09 DIAGNOSIS — I872 Venous insufficiency (chronic) (peripheral): Secondary | ICD-10-CM | POA: Diagnosis not present

## 2023-02-12 DIAGNOSIS — E669 Obesity, unspecified: Secondary | ICD-10-CM | POA: Diagnosis not present

## 2023-02-12 DIAGNOSIS — Z0130 Encounter for examination of blood pressure without abnormal findings: Secondary | ICD-10-CM | POA: Diagnosis not present

## 2023-02-12 DIAGNOSIS — L989 Disorder of the skin and subcutaneous tissue, unspecified: Secondary | ICD-10-CM | POA: Diagnosis not present

## 2023-02-12 DIAGNOSIS — E785 Hyperlipidemia, unspecified: Secondary | ICD-10-CM | POA: Diagnosis not present

## 2023-02-12 DIAGNOSIS — R7303 Prediabetes: Secondary | ICD-10-CM | POA: Diagnosis not present

## 2023-02-12 DIAGNOSIS — Z1389 Encounter for screening for other disorder: Secondary | ICD-10-CM | POA: Diagnosis not present

## 2023-02-12 DIAGNOSIS — Z7189 Other specified counseling: Secondary | ICD-10-CM | POA: Diagnosis not present

## 2023-03-13 DIAGNOSIS — E669 Obesity, unspecified: Secondary | ICD-10-CM | POA: Diagnosis not present

## 2023-03-13 DIAGNOSIS — Z7189 Other specified counseling: Secondary | ICD-10-CM | POA: Diagnosis not present

## 2023-03-13 DIAGNOSIS — Z1389 Encounter for screening for other disorder: Secondary | ICD-10-CM | POA: Diagnosis not present

## 2023-03-13 DIAGNOSIS — Z0130 Encounter for examination of blood pressure without abnormal findings: Secondary | ICD-10-CM | POA: Diagnosis not present

## 2023-03-13 DIAGNOSIS — G478 Other sleep disorders: Secondary | ICD-10-CM | POA: Diagnosis not present

## 2023-03-13 DIAGNOSIS — Z0131 Encounter for examination of blood pressure with abnormal findings: Secondary | ICD-10-CM | POA: Diagnosis not present

## 2023-03-13 DIAGNOSIS — R7303 Prediabetes: Secondary | ICD-10-CM | POA: Diagnosis not present

## 2023-03-26 DIAGNOSIS — R7303 Prediabetes: Secondary | ICD-10-CM | POA: Diagnosis not present

## 2023-03-26 DIAGNOSIS — I10 Essential (primary) hypertension: Secondary | ICD-10-CM | POA: Diagnosis not present

## 2023-03-26 DIAGNOSIS — E669 Obesity, unspecified: Secondary | ICD-10-CM | POA: Diagnosis not present

## 2023-03-26 DIAGNOSIS — Z1389 Encounter for screening for other disorder: Secondary | ICD-10-CM | POA: Diagnosis not present

## 2023-03-26 DIAGNOSIS — Z0130 Encounter for examination of blood pressure without abnormal findings: Secondary | ICD-10-CM | POA: Diagnosis not present

## 2023-03-26 DIAGNOSIS — Z719 Counseling, unspecified: Secondary | ICD-10-CM | POA: Diagnosis not present

## 2023-03-26 DIAGNOSIS — Z0131 Encounter for examination of blood pressure with abnormal findings: Secondary | ICD-10-CM | POA: Diagnosis not present

## 2023-03-28 DIAGNOSIS — I890 Lymphedema, not elsewhere classified: Secondary | ICD-10-CM | POA: Diagnosis not present

## 2023-04-23 DIAGNOSIS — I890 Lymphedema, not elsewhere classified: Secondary | ICD-10-CM | POA: Diagnosis not present

## 2023-05-08 DIAGNOSIS — Z0131 Encounter for examination of blood pressure with abnormal findings: Secondary | ICD-10-CM | POA: Diagnosis not present

## 2023-05-08 DIAGNOSIS — R7303 Prediabetes: Secondary | ICD-10-CM | POA: Diagnosis not present

## 2023-05-08 DIAGNOSIS — Z1389 Encounter for screening for other disorder: Secondary | ICD-10-CM | POA: Diagnosis not present

## 2023-05-08 DIAGNOSIS — E669 Obesity, unspecified: Secondary | ICD-10-CM | POA: Diagnosis not present

## 2023-05-08 DIAGNOSIS — Z0130 Encounter for examination of blood pressure without abnormal findings: Secondary | ICD-10-CM | POA: Diagnosis not present

## 2023-05-08 DIAGNOSIS — Z23 Encounter for immunization: Secondary | ICD-10-CM | POA: Diagnosis not present

## 2023-06-07 DIAGNOSIS — I890 Lymphedema, not elsewhere classified: Secondary | ICD-10-CM | POA: Diagnosis not present

## 2023-07-31 DIAGNOSIS — E785 Hyperlipidemia, unspecified: Secondary | ICD-10-CM | POA: Diagnosis not present

## 2023-07-31 DIAGNOSIS — R7303 Prediabetes: Secondary | ICD-10-CM | POA: Diagnosis not present

## 2023-07-31 DIAGNOSIS — E669 Obesity, unspecified: Secondary | ICD-10-CM | POA: Diagnosis not present

## 2023-08-07 DIAGNOSIS — Z1211 Encounter for screening for malignant neoplasm of colon: Secondary | ICD-10-CM | POA: Diagnosis not present

## 2023-09-17 DIAGNOSIS — N419 Inflammatory disease of prostate, unspecified: Secondary | ICD-10-CM | POA: Diagnosis not present

## 2023-09-17 DIAGNOSIS — Z1389 Encounter for screening for other disorder: Secondary | ICD-10-CM | POA: Diagnosis not present

## 2023-09-17 DIAGNOSIS — R7303 Prediabetes: Secondary | ICD-10-CM | POA: Diagnosis not present

## 2023-09-17 DIAGNOSIS — E669 Obesity, unspecified: Secondary | ICD-10-CM | POA: Diagnosis not present

## 2023-09-17 DIAGNOSIS — Z0130 Encounter for examination of blood pressure without abnormal findings: Secondary | ICD-10-CM | POA: Diagnosis not present

## 2023-10-23 DIAGNOSIS — Z0131 Encounter for examination of blood pressure with abnormal findings: Secondary | ICD-10-CM | POA: Diagnosis not present

## 2023-10-23 DIAGNOSIS — Z0130 Encounter for examination of blood pressure without abnormal findings: Secondary | ICD-10-CM | POA: Diagnosis not present

## 2023-10-23 DIAGNOSIS — N419 Inflammatory disease of prostate, unspecified: Secondary | ICD-10-CM | POA: Diagnosis not present

## 2023-10-23 DIAGNOSIS — R102 Pelvic and perineal pain: Secondary | ICD-10-CM | POA: Diagnosis not present

## 2023-10-23 DIAGNOSIS — Z1389 Encounter for screening for other disorder: Secondary | ICD-10-CM | POA: Diagnosis not present

## 2023-10-23 DIAGNOSIS — Z125 Encounter for screening for malignant neoplasm of prostate: Secondary | ICD-10-CM | POA: Diagnosis not present

## 2023-11-28 DIAGNOSIS — Z0131 Encounter for examination of blood pressure with abnormal findings: Secondary | ICD-10-CM | POA: Diagnosis not present

## 2023-11-28 DIAGNOSIS — Z0130 Encounter for examination of blood pressure without abnormal findings: Secondary | ICD-10-CM | POA: Diagnosis not present

## 2023-11-28 DIAGNOSIS — Z1389 Encounter for screening for other disorder: Secondary | ICD-10-CM | POA: Diagnosis not present

## 2023-11-28 DIAGNOSIS — J019 Acute sinusitis, unspecified: Secondary | ICD-10-CM | POA: Diagnosis not present

## 2024-01-17 DIAGNOSIS — M7918 Myalgia, other site: Secondary | ICD-10-CM | POA: Diagnosis not present

## 2024-02-15 DIAGNOSIS — Z23 Encounter for immunization: Secondary | ICD-10-CM | POA: Diagnosis not present

## 2024-04-04 DIAGNOSIS — G4733 Obstructive sleep apnea (adult) (pediatric): Secondary | ICD-10-CM | POA: Diagnosis not present

## 2024-04-04 DIAGNOSIS — M25552 Pain in left hip: Secondary | ICD-10-CM | POA: Diagnosis not present

## 2024-04-04 DIAGNOSIS — M47816 Spondylosis without myelopathy or radiculopathy, lumbar region: Secondary | ICD-10-CM | POA: Diagnosis not present

## 2024-04-04 DIAGNOSIS — M5136 Other intervertebral disc degeneration, lumbar region with discogenic back pain only: Secondary | ICD-10-CM | POA: Diagnosis not present

## 2024-04-04 DIAGNOSIS — M160 Bilateral primary osteoarthritis of hip: Secondary | ICD-10-CM | POA: Diagnosis not present

## 2024-04-04 DIAGNOSIS — M545 Low back pain, unspecified: Secondary | ICD-10-CM | POA: Diagnosis not present

## 2024-04-04 DIAGNOSIS — M25551 Pain in right hip: Secondary | ICD-10-CM | POA: Diagnosis not present

## 2024-04-22 DIAGNOSIS — I890 Lymphedema, not elsewhere classified: Secondary | ICD-10-CM | POA: Diagnosis not present
